# Patient Record
Sex: Female | Born: 1980 | Hispanic: Yes | Marital: Married | Smoking: Never smoker
Health system: Southern US, Community
[De-identification: ages and names within clinical notes are randomized; demographics above are authoritative.]

## PROBLEM LIST (undated history)

## (undated) DIAGNOSIS — K829 Disease of gallbladder, unspecified: Secondary | ICD-10-CM

## (undated) DIAGNOSIS — O24419 Gestational diabetes mellitus in pregnancy, unspecified control: Secondary | ICD-10-CM

## (undated) DIAGNOSIS — K8020 Calculus of gallbladder without cholecystitis without obstruction: Secondary | ICD-10-CM

## (undated) HISTORY — DX: Calculus of gallbladder without cholecystitis without obstruction: K80.20

## (undated) HISTORY — DX: Gestational diabetes mellitus in pregnancy, unspecified control: O24.419

---

## 2009-03-18 ENCOUNTER — Emergency Department (HOSPITAL_COMMUNITY): Admission: EM | Admit: 2009-03-18 | Discharge: 2009-03-18 | Payer: Self-pay | Admitting: Family Medicine

## 2009-11-13 ENCOUNTER — Other Ambulatory Visit: Admission: RE | Admit: 2009-11-13 | Discharge: 2009-11-13 | Payer: Self-pay | Admitting: Obstetrics and Gynecology

## 2010-05-21 ENCOUNTER — Ambulatory Visit: Payer: Self-pay | Admitting: Advanced Practice Midwife

## 2010-05-21 ENCOUNTER — Inpatient Hospital Stay (HOSPITAL_COMMUNITY): Admission: AD | Admit: 2010-05-21 | Discharge: 2010-05-23 | Payer: Self-pay | Admitting: Obstetrics & Gynecology

## 2010-11-19 LAB — CBC
MCHC: 33.9 g/dL (ref 30.0–36.0)
Platelets: 222 10*3/uL (ref 150–400)
RBC: 3.99 MIL/uL (ref 3.87–5.11)
RDW: 14.3 % (ref 11.5–15.5)
RDW: 14.4 % (ref 11.5–15.5)
WBC: 12.2 10*3/uL — ABNORMAL HIGH (ref 4.0–10.5)
WBC: 8.6 10*3/uL (ref 4.0–10.5)

## 2010-11-19 LAB — RPR: RPR Ser Ql: NONREACTIVE

## 2011-05-06 ENCOUNTER — Other Ambulatory Visit: Payer: Self-pay | Admitting: Obstetrics and Gynecology

## 2011-05-06 DIAGNOSIS — R102 Pelvic and perineal pain: Secondary | ICD-10-CM

## 2011-05-14 ENCOUNTER — Ambulatory Visit (HOSPITAL_COMMUNITY): Payer: Self-pay

## 2011-05-14 ENCOUNTER — Ambulatory Visit (HOSPITAL_COMMUNITY)
Admission: RE | Admit: 2011-05-14 | Discharge: 2011-05-14 | Disposition: A | Discharge status: Home / Self Care | Payer: Self-pay | Source: Ambulatory Visit | Attending: Obstetrics and Gynecology | Admitting: Obstetrics and Gynecology

## 2011-05-14 DIAGNOSIS — R102 Pelvic and perineal pain: Secondary | ICD-10-CM

## 2011-05-14 DIAGNOSIS — Z975 Presence of (intrauterine) contraceptive device: Secondary | ICD-10-CM | POA: Insufficient documentation

## 2011-05-14 DIAGNOSIS — N949 Unspecified condition associated with female genital organs and menstrual cycle: Secondary | ICD-10-CM | POA: Insufficient documentation

## 2012-02-28 ENCOUNTER — Encounter (HOSPITAL_COMMUNITY): Payer: Self-pay | Admitting: *Deleted

## 2012-02-28 ENCOUNTER — Other Ambulatory Visit: Payer: Self-pay

## 2012-02-28 ENCOUNTER — Emergency Department (HOSPITAL_COMMUNITY): Payer: Self-pay

## 2012-02-28 ENCOUNTER — Emergency Department (HOSPITAL_COMMUNITY)
Admission: EM | Admit: 2012-02-28 | Discharge: 2012-02-28 | Disposition: A | Discharge status: Home / Self Care | Payer: Self-pay | Attending: Emergency Medicine | Admitting: Emergency Medicine

## 2012-02-28 DIAGNOSIS — K8050 Calculus of bile duct without cholangitis or cholecystitis without obstruction: Secondary | ICD-10-CM

## 2012-02-28 DIAGNOSIS — K8020 Calculus of gallbladder without cholecystitis without obstruction: Secondary | ICD-10-CM | POA: Insufficient documentation

## 2012-02-28 LAB — DIFFERENTIAL
Basophils Relative: 0 % (ref 0–1)
Lymphocytes Relative: 43 % (ref 12–46)
Lymphs Abs: 3.1 10*3/uL (ref 0.7–4.0)
Monocytes Relative: 6 % (ref 3–12)
Neutro Abs: 3.6 10*3/uL (ref 1.7–7.7)
Neutrophils Relative %: 49 % (ref 43–77)

## 2012-02-28 LAB — HEPATIC FUNCTION PANEL
ALT: 16 U/L (ref 0–35)
AST: 28 U/L (ref 0–37)
Albumin: 4 g/dL (ref 3.5–5.2)
Total Bilirubin: 1.5 mg/dL — ABNORMAL HIGH (ref 0.3–1.2)

## 2012-02-28 LAB — URINALYSIS, ROUTINE W REFLEX MICROSCOPIC
Hgb urine dipstick: NEGATIVE
Nitrite: NEGATIVE
Specific Gravity, Urine: <1.005 — ABNORMAL LOW (ref 1.005–1.030)
Urobilinogen, UA: 0.2 mg/dL (ref 0.0–1.0)

## 2012-02-28 LAB — BASIC METABOLIC PANEL
BUN: 9 mg/dL (ref 6–23)
CO2: 21 mEq/L (ref 19–32)
Chloride: 102 mEq/L (ref 96–112)
GFR calc Af Amer: >90 mL/min (ref >90)
Glucose, Bld: 126 mg/dL — ABNORMAL HIGH (ref 70–99)
Potassium: 3.2 mEq/L — ABNORMAL LOW (ref 3.5–5.1)

## 2012-02-28 LAB — D-DIMER, QUANTITATIVE: D-Dimer, Quant: <0.22 ug/mL-FEU (ref 0.00–0.48)

## 2012-02-28 LAB — URINE MICROSCOPIC-ADD ON

## 2012-02-28 LAB — CBC
HCT: 38.9 % (ref 36.0–46.0)
Hemoglobin: 13.5 g/dL (ref 12.0–15.0)
RBC: 4.52 MIL/uL (ref 3.87–5.11)
WBC: 7.3 10*3/uL (ref 4.0–10.5)

## 2012-02-28 MED ORDER — IBUPROFEN 400 MG PO TABS
400.0000 mg | ORAL_TABLET | Freq: Once | ORAL | Status: AC
  Administered 2012-02-28: 400 mg via ORAL
  Filled 2012-02-28: qty 1

## 2012-02-28 MED ORDER — ONDANSETRON HCL 4 MG PO TABS: 4.0000 mg | ORAL_TABLET | Freq: Three times a day (TID) | ORAL | Status: AC | PRN

## 2012-02-28 MED ORDER — HYDROCODONE-ACETAMINOPHEN 5-325 MG PO TABS: ORAL_TABLET | ORAL | Status: AC

## 2012-02-28 MED ORDER — ACETAMINOPHEN 500 MG PO TABS
1000.0000 mg | ORAL_TABLET | Freq: Once | ORAL | Status: AC
  Administered 2012-02-28: 1000 mg via ORAL
  Filled 2012-02-28: qty 2

## 2012-02-28 NOTE — Discharge Instructions (Signed)
Resource Guide (Spanish)   Problemas Dentales  Pacientes con Medicaid    :                                                               [facility]   [employer] [phone]                          [phone] [address] [place], [place]                          [place], [place]                                             Phone:  [phone]               Phone:  [phone] Si usted no puede pagar o no tiene seguro mdico/dental pngase en contacto con: [facility] o el departamento de salud de [place] para que le autoricen el uso de la Therapist, art para adultos.  Problemas con Dolor Crnico Pngase en contacto con la clinica de dolores cronicos en el hospital de [place], [phone].   Los pacientes deben ser [employer] por su medico de cabecera.  Si usted no tiene dinero para pagar sus medicinas Pngase en contacto con [employer]:  llame al "211" o Insurance underwriter.   Phone: [phone]  Si usted no tiene mdico de [employer]      Phone: [phone]. Otras agencias que ofrecen cuidado mdico barato son:      [facility] [place]  [phone]      [facility]  [phone]      [facility]  [phone]      [facility]  [phone]      [facility]  [phone]      [facility]  [phone]  [facility]  [phone] [facility]  [phone] [facility]  [phone]  (servicios de emergencia [phone])  Abuse/Neglect [facility] Child Abuse Hotline [phone] [facility] Child Abuse Hotline 2485893722 (After Hours)  Emergency Shelter [facility] [phone]  Maternity Homes [facility] [phone] [facility] [phone]  MRSA Hotline #:   [phone]  Servicios de [facility]    [employer]           [facility] [address]                                    [address]       [address] [place]                                         Michell Heinrich  Michell Heinrich Phone:  [phone]                            Phone: [phone]                Phone: [phone]  [employer] [facility] Phone:  [phone]  [facility] Child Abuse Hotline [phone] [phone] (After Hours)     Eat a bland diet, avoiding greasy, fatty, fried foods, as well as spicy and acidic foods or beverages.  Avoid eating within the hour or 2 before going to bed or laying down.  Also avoid teas, colas, coffee, chocolate, pepermint and spearment.  Take the prescriptions as directed.  Call your regular medical doctor and the General Surgeon tomorrow to schedule a follow up appointment within the next week.  Return to the Emergency Department immediately if worsening.

## 2012-02-28 NOTE — ED Notes (Signed)
Chest pain for 15 min,no nausea, sob

## 2012-02-28 NOTE — ED Notes (Signed)
Family at bedside. 

## 2012-02-28 NOTE — ED Provider Notes (Signed)
History     CSN: [id number]  Arrival date & time 02/28/12  1221   First MD Initiated Contact with Patient 02/28/12 1417      Chief Complaint  Patient presents with  . Chest Pain     HPI Pt was seen at 1425.  Per pt, c/o gradual onset and persistence of constant lower mid-sternal chest and upper abd "pain" that began approx noon PTA.  Symptoms began while she was brushing her child's hair.  Describes the pain as lasting for approx 15 minutes before spontaneously resolving.  Pain radiated into her back and was associated with SOB.  Endorses similar symptoms intermittently "for a while," but usually occur several hours after eating.  Denies fevers, no cough, no flank pain, no N/V/D, no rash.    History reviewed. No pertinent past medical history.  History reviewed. No pertinent past surgical history.  History reviewed. No pertinent family history.  History  Substance Use Topics  . Smoking status: Never Smoker   . Smokeless tobacco: Not on file  . Alcohol Use: No    Review of Systems ROS: Statement: All systems negative except as marked or noted in the HPI; Constitutional: Negative for fever and chills. ; ; Eyes: Negative for eye pain, redness and discharge. ; ; ENMT: Negative for ear pain, hoarseness, nasal congestion, sinus pressure and sore throat. ; ; Cardiovascular: +CP, SOB. Negative for palpitations, diaphoresis, and peripheral edema. ; ; Respiratory: Negative for cough, wheezing and stridor. ; ; Gastrointestinal: +abd pain. Negative for nausea, vomiting, diarrhea, blood in stool, hematemesis, jaundice and rectal bleeding. . ; ; Genitourinary: Negative for dysuria, flank pain and hematuria. ; ; Musculoskeletal: Negative for back pain and neck pain. Negative for swelling and trauma.; ; Skin: Negative for pruritus, rash, abrasions, blisters, bruising and skin lesion.; ; Neuro: Negative for headache, lightheadedness and neck stiffness. Negative for weakness, altered level of  consciousness , altered mental status, extremity weakness, paresthesias, involuntary movement, seizure and syncope.     Allergies  Review of patient's allergies indicates no known allergies.  Home Medications  No current outpatient prescriptions on file.  BP 117/62  Pulse 67  Temp 98.4 F (36.9 C) (Oral)  Resp 20  Ht 5\' 6"  (1.676 m)  Wt 130 lb (58.968 kg)  BMI 20.98 kg/m2  SpO2 100%  LMP 02/21/2012  Physical Exam 1430: Physical examination:  Nursing notes reviewed; Vital signs and O2 SAT reviewed;  Constitutional: Well developed, Well nourished, Well hydrated, In no acute distress; Head:  Normocephalic, atraumatic; Eyes: EOMI, PERRL, No scleral icterus; ENMT: Mouth and pharynx normal, Mucous membranes moist; Neck: Supple, Full range of motion, No lymphadenopathy; Cardiovascular: Regular rate and rhythm, No murmur, rub, or gallop; Respiratory: Breath sounds clear & equal bilaterally, No rales, rhonchi, wheezes.  Speaking full sentences with ease, Normal respiratory effort/excursion; Chest: Nontender, Movement normal; Abdomen: Soft, Nontender, Nondistended, Normal bowel sounds; Genitourinary: No CVA tenderness; Extremities: Pulses normal, No tenderness, No edema, No calf edema or asymmetry.; Neuro: AA&Ox3, Major CN grossly intact.  Speech clear. No gross focal motor or sensory deficits in extremities.; Skin: Color normal, Warm, Dry.   ED Course  Procedures   MDM  MDM Reviewed: nursing note and vitals Interpretation: ECG, labs and x-ray    Date: 02/28/2012  Rate: 67  Rhythm: normal sinus rhythm  QRS Axis: normal  Intervals: normal  ST/T Wave abnormalities: normal  Conduction Disutrbances:none  Narrative Interpretation:   Old EKG Reviewed: none available.   Results for orders placed  during the hospital encounter of 02/28/12  CBC      Component Value Range   WBC 7.3  4.0 - 10.5 K/uL   RBC 4.52  3.87 - 5.11 MIL/uL   Hemoglobin 13.5  12.0 - 15.0 g/dL   HCT 16.1  09.6 -  04.5 %   MCV 86.1  78.0 - 100.0 fL   MCH 29.9  26.0 - 34.0 pg   MCHC 34.7  30.0 - 36.0 g/dL   RDW 40.9  81.1 - 91.4 %   Platelets 214  150 - 400 K/uL  DIFFERENTIAL      Component Value Range   Neutrophils Relative 49  43 - 77 %   Neutro Abs 3.6  1.7 - 7.7 K/uL   Lymphocytes Relative 43  12 - 46 %   Lymphs Abs 3.1  0.7 - 4.0 K/uL   Monocytes Relative 6  3 - 12 %   Monocytes Absolute 0.5  0.1 - 1.0 K/uL   Eosinophils Relative 1  0 - 5 %   Eosinophils Absolute 0.1  0.0 - 0.7 K/uL   Basophils Relative 0  0 - 1 %   Basophils Absolute 0.0  0.0 - 0.1 K/uL  BASIC METABOLIC PANEL      Component Value Range   Sodium 137  135 - 145 mEq/L   Potassium 3.2 (*) 3.5 - 5.1 mEq/L   Chloride 102  96 - 112 mEq/L   CO2 21  19 - 32 mEq/L   Glucose, Bld 126 (*) 70 - 99 mg/dL   BUN 9  6 - 23 mg/dL   Creatinine, Ser 7.82  0.50 - 1.10 mg/dL   Calcium 9.7  8.4 - 95.6 mg/dL   GFR calc non Af Amer >90  >90 mL/min   GFR calc Af Amer >90  >90 mL/min  TROPONIN I      Component Value Range   Troponin I <0.30  <0.30 ng/mL  HEPATIC FUNCTION PANEL      Component Value Range   Total Protein 7.5  6.0 - 8.3 g/dL   Albumin 4.0  3.5 - 5.2 g/dL   AST 28  0 - 37 U/L   ALT 16  0 - 35 U/L   Alkaline Phosphatase 55  39 - 117 U/L   Total Bilirubin 1.5 (*) 0.3 - 1.2 mg/dL   Bilirubin, Direct 0.3  0.0 - 0.3 mg/dL   Indirect Bilirubin 1.2 (*) 0.3 - 0.9 mg/dL  LIPASE, BLOOD      Component Value Range   Lipase 28  11 - 59 U/L  D-DIMER, QUANTITATIVE      Component Value Range   D-Dimer, Quant <0.22  0.00 - 0.48 ug/mL-FEU  PREGNANCY, URINE      Component Value Range   Preg Test, Ur NEGATIVE  NEGATIVE  URINALYSIS, ROUTINE W REFLEX MICROSCOPIC      Component Value Range   Color, Urine YELLOW  YELLOW   APPearance CLEAR  CLEAR   Specific Gravity, Urine <1.005 (*) 1.005 - 1.030   pH 6.5  5.0 - 8.0   Glucose, UA NEGATIVE  NEGATIVE mg/dL   Hgb urine dipstick NEGATIVE  NEGATIVE   Bilirubin Urine NEGATIVE  NEGATIVE    Ketones, ur NEGATIVE  NEGATIVE mg/dL   Protein, ur NEGATIVE  NEGATIVE mg/dL   Urobilinogen, UA 0.2  0.0 - 1.0 mg/dL   Nitrite NEGATIVE  NEGATIVE   Leukocytes, UA TRACE (*) NEGATIVE  URINE MICROSCOPIC-ADD ON  Component Value Range   Squamous Epithelial / LPF FEW (*) RARE   WBC, UA 0-2  <3 WBC/hpf   Bacteria, UA RARE  RARE   Dg Chest 2 View 02/28/2012  *RADIOLOGY REPORT*  Clinical Data: Chest pain  CHEST - 2 VIEW  Comparison: None  Findings: Upper-normal size of cardiac silhouette. Mediastinal contours and pulmonary vascularity normal. Numerous cardiac monitoring leads project over chest. Lungs appear slightly hyperaerated but clear. No pleural effusion or pneumothorax. No acute osseous findings.  IMPRESSION: Slightly hyperinflated lungs. Otherwise negative exam.  Original Report Authenticated By: Lollie Marrow, M.D.   US Abdomen Complete 02/28/2012  *RADIOLOGY REPORT*  Clinical Data:  Upper abdominal pain.  COMPLETE ABDOMINAL ULTRASOUND  Comparison:  None.  Findings:  Gallbladder:  Gallbladder is filled with gallstones.  No wall thickening.  Negative sonographic Murphy's.  Common bile duct:   Normal caliber, 6 mm.  Liver:  No focal lesion identified.  Within normal limits in parenchymal echogenicity.  IVC:  Appears normal.  Pancreas:  No focal abnormality seen.  Spleen:  Within normal limits in size and echotexture.  Right Kidney:   Normal in size and parenchymal echogenicity.  No evidence of mass or hydronephrosis.  Left Kidney:  Normal in size and parenchymal echogenicity.  No evidence of mass or hydronephrosis.  Abdominal aorta:  No aneurysm identified.  IMPRESSION: Gallbladder is filled with gallstones.  No changes of acute cholecystitis.  Original Report Authenticated By: Cyndie Chime, M.D.     6:28 PM:   Pt stated her pain was minimal by arrival to ED, continues improved and wants to go home now.  No LFT or lipase elevation.  No signs of acute cholecystitis on [place].  Possible biliary colic  at this time.  Strict diet instructions and return precautions given.  Dx testing d/w pt and family.  Questions answered.  Verb understanding, agreeable to d/c home with outpt f/u.           Laray Anger, DO 03/02/12 1937

## 2012-02-28 NOTE — ED Notes (Signed)
Patient is comfortable and does not need anything at this time. 

## 2012-09-01 ENCOUNTER — Emergency Department (HOSPITAL_COMMUNITY)
Admission: EM | Admit: 2012-09-01 | Discharge: 2012-09-01 | Disposition: A | Discharge status: Home / Self Care | Payer: Self-pay | Attending: Emergency Medicine | Admitting: Emergency Medicine

## 2012-09-01 ENCOUNTER — Encounter (HOSPITAL_COMMUNITY): Payer: Self-pay | Admitting: Nurse Practitioner

## 2012-09-01 DIAGNOSIS — K5900 Constipation, unspecified: Secondary | ICD-10-CM | POA: Insufficient documentation

## 2012-09-01 DIAGNOSIS — R197 Diarrhea, unspecified: Secondary | ICD-10-CM | POA: Insufficient documentation

## 2012-09-01 DIAGNOSIS — R110 Nausea: Secondary | ICD-10-CM | POA: Insufficient documentation

## 2012-09-01 DIAGNOSIS — Z3202 Encounter for pregnancy test, result negative: Secondary | ICD-10-CM | POA: Insufficient documentation

## 2012-09-01 DIAGNOSIS — Z8719 Personal history of other diseases of the digestive system: Secondary | ICD-10-CM | POA: Insufficient documentation

## 2012-09-01 DIAGNOSIS — G8929 Other chronic pain: Secondary | ICD-10-CM | POA: Insufficient documentation

## 2012-09-01 LAB — COMPREHENSIVE METABOLIC PANEL
Alkaline Phosphatase: 60 U/L (ref 39–117)
BUN: 9 mg/dL (ref 6–23)
Calcium: 9.3 mg/dL (ref 8.4–10.5)
Creatinine, Ser: 0.72 mg/dL (ref 0.50–1.10)
GFR calc Af Amer: >90 mL/min (ref >90)
Glucose, Bld: 94 mg/dL (ref 70–99)
Total Protein: 7.4 g/dL (ref 6.0–8.3)

## 2012-09-01 LAB — CBC WITH DIFFERENTIAL/PLATELET
Eosinophils Absolute: 0.2 10*3/uL (ref 0.0–0.7)
Eosinophils Relative: 3 % (ref 0–5)
Hemoglobin: 13.8 g/dL (ref 12.0–15.0)
Lymphs Abs: 1.2 10*3/uL (ref 0.7–4.0)
MCH: 29.4 pg (ref 26.0–34.0)
MCV: 87.2 fL (ref 78.0–100.0)
Monocytes Absolute: 0.5 10*3/uL (ref 0.1–1.0)
Monocytes Relative: 7 % (ref 3–12)
RBC: 4.69 MIL/uL (ref 3.87–5.11)

## 2012-09-01 LAB — URINALYSIS, MICROSCOPIC ONLY
Bilirubin Urine: NEGATIVE
Nitrite: NEGATIVE
Specific Gravity, Urine: 1.027 (ref 1.005–1.030)
Urobilinogen, UA: 2 mg/dL — ABNORMAL HIGH (ref 0.0–1.0)

## 2012-09-01 LAB — LIPASE, BLOOD: Lipase: 23 U/L (ref 11–59)

## 2012-09-01 MED ORDER — DICYCLOMINE HCL 20 MG PO TABS: 20.0000 mg | ORAL_TABLET | Freq: Two times a day (BID) | ORAL | Status: DC

## 2012-09-01 MED ORDER — GI COCKTAIL ~~LOC~~
30.0000 mL | Freq: Once | ORAL | Status: AC
  Administered 2012-09-01: 30 mL via ORAL
  Filled 2012-09-01: qty 30

## 2012-09-01 MED ORDER — PANTOPRAZOLE SODIUM 20 MG PO TBEC: 40.0000 mg | DELAYED_RELEASE_TABLET | Freq: Every day | ORAL | Status: DC

## 2012-09-01 NOTE — ED Provider Notes (Signed)
History     CSN: [id number]  Arrival date & time 09/01/12  1635   First MD Initiated Contact with Patient 09/01/12 2043      Chief Complaint  Patient presents with  . Abdominal Pain    (Consider location/radiation/quality/duration/timing/severity/associated sxs/prior treatment) Patient is a 31 y.o. female presenting with general illness. The history is provided by the patient. No language interpreter was used.  Illness  The current episode started more than 2 weeks ago. The onset is undetermined. The problem occurs frequently. The problem has been unchanged. The problem is moderate. Nothing relieves the symptoms. Nothing aggravates the symptoms. Associated symptoms include abdominal pain, constipation, diarrhea and nausea. Pertinent negatives include no fever, no vomiting, no congestion, no headaches, no sore throat, no cough and no rash.    Past Medical History  Diagnosis Date  . Gallbladder attack     History reviewed. No pertinent past surgical history.  History reviewed. No pertinent family history.  History  Substance Use Topics  . Smoking status: Never Smoker   . Smokeless tobacco: Not on file  . Alcohol Use: No    OB History    Grav Para Term Preterm Abortions TAB SAB Ect Mult Living                  Review of Systems  Constitutional: Negative for fever and chills.  HENT: Negative for congestion and sore throat.   Respiratory: Negative for cough and shortness of breath.   Cardiovascular: Negative for chest pain and leg swelling.  Gastrointestinal: Positive for nausea, abdominal pain, diarrhea and constipation. Negative for vomiting.  Genitourinary: Negative for dysuria and frequency.  Skin: Negative for color change and rash.  Neurological: Negative for dizziness and headaches.  Psychiatric/Behavioral: Negative for confusion and agitation.  All other systems reviewed and are negative.    Allergies  Review of patient's allergies indicates no known  allergies.  Home Medications  No current outpatient prescriptions on file.  BP 117/62  Pulse 70  Temp 98.4 F (36.9 C) (Oral)  Resp 14  SpO2 100%  Physical Exam  Vitals reviewed. Constitutional: She is oriented to person, place, and time. She appears well-developed and well-nourished. No distress.  HENT:  Head: Normocephalic and atraumatic.  Eyes: EOM are normal. Pupils are equal, round, and reactive to light.  Neck: Normal range of motion. Neck supple.  Cardiovascular: Normal rate and regular rhythm.   Pulmonary/Chest: Effort normal. No respiratory distress.  Abdominal: Soft. She exhibits no distension. There is no hepatosplenomegaly. There is tenderness in the right lower quadrant and left lower quadrant. There is no rigidity, no rebound, no guarding, no CVA tenderness, no tenderness at McBurney's point and negative Murphy's sign.    Musculoskeletal: Normal range of motion. She exhibits no edema.  Neurological: She is alert and oriented to person, place, and time.  Skin: Skin is warm and dry.  Psychiatric: She has a normal mood and affect. Her behavior is normal.    ED Course  Procedures (including critical care time)  Results for orders placed during the hospital encounter of 09/01/12  CBC WITH DIFFERENTIAL      Component Value Range   WBC 7.1  4.0 - 10.5 K/uL   RBC 4.69  3.87 - 5.11 MIL/uL   Hemoglobin 13.8  12.0 - 15.0 g/dL   HCT 14.7  82.9 - 56.2 %   MCV 87.2  78.0 - 100.0 fL   MCH 29.4  26.0 - 34.0 pg   MCHC 33.7  30.0 - 36.0 g/dL   RDW 40.9  81.1 - 91.4 %   Platelets 196  150 - 400 K/uL   Neutrophils Relative 73  43 - 77 %   Neutro Abs 5.2  1.7 - 7.7 K/uL   Lymphocytes Relative 17  12 - 46 %   Lymphs Abs 1.2  0.7 - 4.0 K/uL   Monocytes Relative 7  3 - 12 %   Monocytes Absolute 0.5  0.1 - 1.0 K/uL   Eosinophils Relative 3  0 - 5 %   Eosinophils Absolute 0.2  0.0 - 0.7 K/uL   Basophils Relative 0  0 - 1 %   Basophils Absolute 0.0  0.0 - 0.1 K/uL    COMPREHENSIVE METABOLIC PANEL      Component Value Range   Sodium 139  135 - 145 mEq/L   Potassium 3.6  3.5 - 5.1 mEq/L   Chloride 102  96 - 112 mEq/L   CO2 27  19 - 32 mEq/L   Glucose, Bld 94  70 - 99 mg/dL   BUN 9  6 - 23 mg/dL   Creatinine, Ser 7.82  0.50 - 1.10 mg/dL   Calcium 9.3  8.4 - 95.6 mg/dL   Total Protein 7.4  6.0 - 8.3 g/dL   Albumin 3.9  3.5 - 5.2 g/dL   AST 12  0 - 37 U/L   ALT 9  0 - 35 U/L   Alkaline Phosphatase 60  39 - 117 U/L   Total Bilirubin 1.4 (*) 0.3 - 1.2 mg/dL   GFR calc non Af Amer >90  >90 mL/min   GFR calc Af Amer >90  >90 mL/min  LIPASE, BLOOD      Component Value Range   Lipase 23  11 - 59 U/L  URINALYSIS, MICROSCOPIC ONLY      Component Value Range   Color, Urine YELLOW  YELLOW   APPearance CLEAR  CLEAR   Specific Gravity, Urine 1.027  1.005 - 1.030   pH 8.0  5.0 - 8.0   Glucose, UA NEGATIVE  NEGATIVE mg/dL   Hgb urine dipstick NEGATIVE  NEGATIVE   Bilirubin Urine NEGATIVE  NEGATIVE   Ketones, ur NEGATIVE  NEGATIVE mg/dL   Protein, ur NEGATIVE  NEGATIVE mg/dL   Urobilinogen, UA 2.0 (*) 0.0 - 1.0 mg/dL   Nitrite NEGATIVE  NEGATIVE   Leukocytes, UA NEGATIVE  NEGATIVE   WBC, UA 0-2  <3 WBC/hpf   RBC / HPF 0-2  <3 RBC/hpf   Bacteria, UA RARE  RARE   Squamous Epithelial / LPF FEW (*) RARE  POCT PREGNANCY, URINE      Component Value Range   Preg Test, Ur NEGATIVE  NEGATIVE    No results found.   No diagnosis found.    MDM  Pt w/ > 6 month hx of crampy diffuse abdominal pain. Seen at [facility] and dx w/ UTI. States persistent pain, worse at night. At times radiates to left flank.  A/w nausea - no vomiting. Intermittent episodes of diarrhea and constipation. Dysuria/polyuria/hematura. No vaginal bleeding/discharge or irritation. Has hx of biliary pain - no dx of cholecystitis/choledocholithiasis. Denies fever/chills.   Exam: afebrile, vitals unremarkable, well appearing, NAD. No CVA tenderness, abd soft, minimally ttp BLQ, no  rebound or guarding - no clinical peritonitis. No suprapubic pain.   Plan: u/a neg, cmp lipase and cbc unremarkable, hcg neg. Based on chronicity of sx, benign exam and lack of laboratory abnormality - doubt acute  cholecystitis/cholangitis, SBO, mesenteric ischemia, AAA, perforated viscus, diverticulitis, ectopic, PID, torsion, TOA. I feel that her sx are possibly 2/2 IBS/IBD or gastritis/PUD. Given GI cocktail w/ some relief of sx. At this time no indication for imaging. Will d/c home w/ rx for bentyl and protonix. Follow up w/ GI for chronic abdominal pain and consider scope. D/c in good and stable condition w/ return precautions.   1. Chronic abdominal pain   2. Nausea   3. Diarrhea    New Prescriptions   DICYCLOMINE (BENTYL) 20 MG TABLET    Take 1 tablet (20 mg total) by mouth 2 (two) times daily.   PANTOPRAZOLE (PROTONIX) 20 MG TABLET    Take 2 tablets (40 mg total) by mouth daily.   [facility]er END[facility] [address] [place] [address]  Schedule an appointment as soon as possible for a visit          Audelia Hives, MD 09/02/12 [phone]

## 2012-09-01 NOTE — ED Notes (Signed)
Co intermittent abd pain and nausea for past 6 months, worse at night and after eating. States pain is generalized across entire abd and sometimes radiates into flank area. Pt denies any similar history.

## 2012-09-01 NOTE — ED Notes (Signed)
Pt dc to home.  Pt states understanding to dc paperwork and will f/u with gastro md on Monday.  Pt ambulatory to exit without difficulty.  Pt denies need for w/c

## 2012-09-02 NOTE — ED Provider Notes (Signed)
I saw and evaluated the patient, reviewed the resident's note and I agree with the findings and plan.  Ethelda Chick, MD 09/02/12 [phone]

## 2012-09-07 ENCOUNTER — Encounter (HOSPITAL_COMMUNITY): Payer: Self-pay | Admitting: *Deleted

## 2012-12-25 ENCOUNTER — Ambulatory Visit (INDEPENDENT_AMBULATORY_CARE_PROVIDER_SITE_OTHER): Payer: BC Managed Care – PPO | Admitting: Emergency Medicine

## 2012-12-25 VITALS — BP 122/78 | HR 86 | Temp 98.5°F | Resp 16 | Ht 65.0 in | Wt 133.0 lb

## 2012-12-25 DIAGNOSIS — K8020 Calculus of gallbladder without cholecystitis without obstruction: Secondary | ICD-10-CM

## 2012-12-25 DIAGNOSIS — K5900 Constipation, unspecified: Secondary | ICD-10-CM

## 2012-12-25 DIAGNOSIS — R1032 Left lower quadrant pain: Secondary | ICD-10-CM

## 2012-12-25 LAB — POCT URINALYSIS DIPSTICK
Bilirubin, UA: NEGATIVE
Glucose, UA: NEGATIVE
Ketones, UA: NEGATIVE
Leukocytes, UA: NEGATIVE

## 2012-12-25 LAB — POCT CBC
Granulocyte percent: 54 %G (ref 37–80)
HCT, POC: 39.6 % (ref 37.7–47.9)
Hemoglobin: 12.6 g/dL (ref 12.2–16.2)
Lymph, poc: 2.5 (ref 0.6–3.4)
POC Granulocyte: 3.5 (ref 2–6.9)
POC MID %: 7.9 %M (ref 0–12)
RBC: 4.42 M/uL (ref 4.04–5.48)

## 2012-12-25 LAB — POCT UA - MICROSCOPIC ONLY
Bacteria, U Microscopic: NEGATIVE
Mucus, UA: NEGATIVE
Yeast, UA: NEGATIVE

## 2012-12-25 MED ORDER — POLYETHYLENE GLYCOL 3350 17 GM/SCOOP PO POWD: 17.0000 g | Freq: Every day | ORAL | Status: DC

## 2012-12-25 NOTE — Progress Notes (Signed)
[facility] [address] [place] [address] [phone]  Date:  12/25/2012   Name:  [name] Wu   DOB:  Dec 06, 1980   MRN:  [id number]  PCP:  No primary provider on file.    Chief Complaint: Abdominal Pain   History of Present Illness:  [name] Wu is a 32 y.o. very pleasant female patient who presents with the following:  Left sided abdominal pain started yesterday.  No nausea or vomiting.  No stool change.  Tends to be constipated and LLQ pain worse when strains at stool  The patient has no complaint of blood, mucous, or pus in her stools.  No fever or chills.  Uses an IUD.  LMP 12/09/12.  No dysuria, urgency or frequency. Some dyspareunia.  History of gallstones but pain is not in RUQ.  Some increase in pain with  Intolerance of greasy and fried foods and chilles.  No vaginal discharge.  No improvement with over the counter medications or other home remedies. Denies other complaint or health concern today.   There is no problem list on file for this patient.   Past Medical History  Diagnosis Date  . Gallbladder attack     History reviewed. No pertinent past surgical history.  History  Substance Use Topics  . Smoking status: Never Smoker   . Smokeless tobacco: Not on file  . Alcohol Use: No    History reviewed. No pertinent family history.  No Known Allergies  Medication list has been reviewed and updated.  Current Outpatient Prescriptions on File Prior to Visit  Medication Sig Dispense Refill  . dicyclomine (BENTYL) 20 MG tablet Take 1 tablet (20 mg total) by mouth 2 (two) times daily.  20 tablet  0  . pantoprazole (PROTONIX) 20 MG tablet Take 2 tablets (40 mg total) by mouth daily.  30 tablet  0   No current facility-administered medications on file prior to visit.    Review of Systems:  As per HPI, otherwise negative.    Physical Examination: Filed Vitals:   12/25/12 0929  BP: 122/78  Pulse: 86  Temp: 98.5 F  (36.9 C)  Resp: 16   Filed Vitals:   12/25/12 0929  Height: 5\' 5"  (1.651 m)  Weight: 133 lb (60.328 kg)   Body mass index is 22.13 kg/(m^2). Ideal Body Weight: Weight in (lb) to have BMI = 25: 149.9  GEN: WDWN, NAD, Non-toxic, A & O x 3 HEENT: Atraumatic, Normocephalic. Neck supple. No masses, No LAD. Ears and Nose: No external deformity. CV: RRR, No M/G/R. No JVD. No thrill. No extra heart sounds. PULM: CTA B, no wheezes, crackles, rhonchi. No retractions. No resp. distress. No accessory muscle use. ABD: S, tender minimally in LLQ, ND, +BS. No rebound. No HSM. EXTR: No c/c/e NEURO Normal gait.  PSYCH: Normally interactive. Conversant. Not depressed or anxious appearing.  Calm demeanor.    Assessment and Plan: Cholelithiasis Constipation Surgical consultation miralax   Signed,  Phillips Odor, MD   Results for orders placed in visit on 12/25/12  POCT CBC      Result Value Range   WBC 6.5  4.6 - 10.2 K/uL   Lymph, poc 2.5  0.6 - 3.4   POC LYMPH PERCENT 38.1  10 - 50 %L   MID (cbc) 0.5  0 - 0.9   POC MID % 7.9  0 - 12 %M   POC Granulocyte 3.5  2 - 6.9   Granulocyte percent 54.0  37 -  80 %G   RBC 4.42  4.04 - 5.48 M/uL   Hemoglobin 12.6  12.2 - 16.2 g/dL   HCT, POC 19.1  47.8 - 47.9 %   MCV 89.5  80 - 97 fL   MCH, POC 28.5  27 - 31.2 pg   MCHC 31.8  31.8 - 35.4 g/dL   RDW, POC 29.5     Platelet Count, POC 224  142 - 424 K/uL   MPV 10.1  0 - 99.8 fL  POCT URINALYSIS DIPSTICK      Result Value Range   Color, UA yellow     Clarity, UA clear     Glucose, UA neg     Bilirubin, UA neg     Ketones, UA neg     Spec Grav, UA 1.025     Blood, UA small     pH, UA 7.0     Protein, UA neg     Urobilinogen, UA 0.2     Nitrite, UA neg     Leukocytes, UA Negative    POCT UA - MICROSCOPIC ONLY      Result Value Range   WBC, Ur, HPF, POC 0-2     RBC, urine, microscopic 0-1     Bacteria, U Microscopic neg     Mucus, UA neg     Epithelial cells, urine per micros  0-2     Crystals, Ur, HPF, POC neg     Casts, Ur, LPF, POC neg     Yeast, UA neg    POCT URINE PREGNANCY      Result Value Range   Preg Test, Ur Negative

## 2012-12-25 NOTE — Patient Instructions (Addendum)
Constipacin - Adulto  (Constipation, Adult)  Constipacin significa que una persona tiene menos de 3 evacuaciones en una semana, hay dificultad para evacuar el intestino, o las heces son secas, duras, o ms grandes que lo normal. A medida que envejecemos el estreimiento es ms comn. Si intenta curar el estreimiento con medicamentos que producen la evacuacin intestinal (laxantes), el problema puede empeorar. El uso prolongado de laxantes puede hacer que los msculos del colon se debiliten. Una dieta baja en fibra, no tomar suficientes lquidos y el uso de ciertos medicamentos pueden Agricultural engineer.  CAUSAS   Ciertos medicamentos, como los antidepresivos, analgsicos, suplementos de hierro, anticidos y diurticos.   Algunas enfermedades, como la diabetes, el sndrome del colon irritable (SII), enfermedad de la tiroides, o depresin.   No beber suficiente agua.   No consumir suficientes alimentos ricos en fibra.   Situaciones de estrs o viajes.  Falta de actividad fsica o de ejercicio.  No ir al bao cuando siente la necesidad.  Ignorar la necesidad sbita de Film/video editor intestino.  Uso en exceso de laxantes. SNTOMAS   Evacuar el intestino menos de 3 veces a la semana.   Dificultad para mover el [place] y duras, o ms grandes que las normales.   Sensacin de estar lleno o distendido.   Dolor en la parte baja del abdomen  No se siente alivio despus de evacuar el intestino. DIAGNSTICO  El mdico le har una historia clnica y le har un examen fsico. Pueden hacerle exmenes adicionales para el estreimiento grave. Algunas pruebas son:   Un radiografa con enema de bario para examinar el recto, el colon y en algunos casos el intestino delgado.  Una sigmoidoscopia para examinar el colon inferior.  Una colonoscopia para examinar todo el colon. TRATAMIENTO  El tratamiento depender de la gravedad de la constipacin y de la  causa. Algunos tratamientos dietticos son beber ms lquidos y comer ms alimentos ricos en fibra. El cambio en el estilo de vida incluye hacer ejercicios de [place] regular. Si estas recomendaciones para Animator dieta y en el estilo de vida no ayudan, el mdico le puede indicar el uso de laxantes de venta libre para Industrial/product designer movimiento intestinal. Los medicamentos con Statistician se pueden prescribir si los medicamentos de venta libre no lo mejoran.  INSTRUCCIONES PARA EL CUIDADO EN EL HOGAR   Aumente el consumo de alimentos con [place], como frutas, verduras, granos enteros y frijoles. Limite los azcares ricos en grasas y procesados   en su dieta, tales como papas fritas, hamburguesas, galletas, dulces y refrescos.   Puede agregar un suplemento de fibra a su dieta si no obtiene lo suficiente de los alimentos.   Debe ingerir gran cantidad de lquido para mantener la orina de tono claro o color amarillo plido.   Haga ejercicios regularmente o segn las indicaciones de su mdico.   Vaya al bao cuando sienta la necesidad de ir. No espere.  Tome slo la medicacin que le indic el profesional.  No tome otros medicamentos para la constipacin sin Teacher, adult education a su mdico. SOLICITE ATENCIN MDICA DE INMEDIATO SI:   Observa sangre brillante en las heces.   La constipacin dura ms de 4 das o empeora.   Siente dolor abdominal o rectal.   Las heces son delgadas como un lpiz.  Pierde peso de [place] inexplicable. ASEGRESE DE QUE:   Comprende estas instrucciones.  Controlar su enfermedad.  Solicitar ayuda de inmediato si  no mejora o empeora. Document Released: 09/12/2007 Document Revised: 02/22/2012 [facility] Patient Information 2013 [place], [place]. Colecistitis  (Cholecystitis)  La colecistitis es la inflamacin de la vescula biliar. Generalmente la causa es la formacin de clculos biliares o sedimentos (colelitiasis)) en la vescula. La vescula almacena un  lquido que ayuda a digerir las grasas (bilis). La colecistitis es una enfermedad grave y requiere tratamiento inmediato.  CAUSAS   Clculos biliares. Los clculos biliares pueden obstruir el conducto que conduce a la vescula biliar, causando la acumulacin de bilis. Cuando la bilis se acumula, la vescula biliar se inflama.  Problemas en el conducto biliar, como la obstruccin por cicatrizacin o torsin.  Tumores. Los tumores pueden impedir que la bilis salga de la vescula adecuadamente, causando la acumulacin de la misma. Cuando la bilis se acumula, la vescula se inflama. SNTOMAS   Nuseas  Vmitos.  Dolor abdominal, especialmente en la zona superior derecha del abdomen.  Sensibilidad o hinchazn abdominal.  Sudoracin.  Escalofros.  Grant Ruts.  Color amarillo de la piel y en la zona blanca del ojo (ictericia). DIAGNSTICO  Su mdico puede indicar exmenes de sangre para Engineer, manufacturing una infeccin o problemas en la vescula biliar. Tambin puede ordenar pruebas de diagnstico por imgenes, como ecografas o tomografa computada (CT). Otras pruebas pueden incluir un estudio de gammagrafa hepatobiliar con cido iminodiactico (HIDA). Esta exploracin permite a su mdico ver el paso de la bilis desde el hgado hasta la vescula biliar y el intestino delgado.  TRATAMIENTO  La hospitalizacin suele ser necesaria para disminuir la inflamacin de la vescula biliar. Posiblemente le indiquen que no coma ni beba nada (ayuno) durante cierto perodo de [place]. Podrn indicarle un medicamento para calmar el dolor o un antibitico para tratar la infeccin. Puede ser necesario realizar Neomia Dear ciruga para extirpar la vescula biliar (colecistectoma) cuando la inflamacin haya disminudo. Podra necesitar de inmediato una ciruga si aparecen complicaciones como la muerte del tejido de la vescula biliar (gangrena) o la ruptura (perforacin)) de la vescula biliar.  INSTRUCCIONES PARA EL CUIDADO EN EL  HOGAR  El cuidado en el hogar depender del tipo de [place]. En general:   Si le han recetado antibiticos, tmelos segn las indicaciones. Tmelos todos, aunque se sienta mejor.  Solo tome medicamentos de venta libre o recetados para Chief Technology Officer, Dentist o [place], segn las indicaciones del mdico.  Siga una dieta baja en grasas hasta que vuelva a ver al mdico nuevamente.  Cumpla con todas las visitas de control, segn le indique su mdico. SOLICITE ATENCIN MDICA DE INMEDIATO SI:   El dolor aumenta y no puede controlarlo con los medicamentos.  El dolor se traslada hacia alguna otra zona del abdomen o hacia la espalda.  Tiene fiebre.  Tiene nuseas o vmitos. ASEGRESE DE QUE:   Comprende estas instrucciones.  Controlar su enfermedad.  Solicitar ayuda de inmediato si no mejora o si empeora. Document Released: 06/02/2005 Document Revised: 11/15/2011 [facility] Patient Information 2013 [place], [place].

## 2013-01-16 ENCOUNTER — Ambulatory Visit (INDEPENDENT_AMBULATORY_CARE_PROVIDER_SITE_OTHER): Payer: Self-pay | Admitting: Surgery

## 2013-01-30 ENCOUNTER — Encounter (INDEPENDENT_AMBULATORY_CARE_PROVIDER_SITE_OTHER): Payer: Self-pay | Admitting: Surgery

## 2013-01-30 ENCOUNTER — Ambulatory Visit (INDEPENDENT_AMBULATORY_CARE_PROVIDER_SITE_OTHER): Payer: BC Managed Care – PPO | Admitting: Surgery

## 2013-01-30 VITALS — BP 120/62 | HR 60 | Temp 97.4°F | Resp 16 | Ht 65.0 in | Wt 136.0 lb

## 2013-01-30 DIAGNOSIS — K8020 Calculus of gallbladder without cholecystitis without obstruction: Secondary | ICD-10-CM | POA: Insufficient documentation

## 2013-01-30 HISTORY — DX: Calculus of gallbladder without cholecystitis without obstruction: K80.20

## 2013-01-30 NOTE — Progress Notes (Signed)
Patient ID: [name] Wu, female   DOB: [birth year], 32 y.o.   MRN: [id number]  Chief Complaint  Patient presents with  . New Evaluation    eval cholelithiasis    HPI [name] Wu is a 32 y.o. female.   HPI She is referred by Dr. Noreene Wu for evaluation of symptomatic cholelithiasis. She has known for at least a year that she has had gallstones. She has been having attacks of right upper quadrant abdominal pain nausea and vomiting after fatty meals for over a year. Currently today she is pain-free. Her attacks are quite frequent. Her pain to be moderate to severe in intensity. She denies jaundice. Bowel movements are normal. Past Medical History  Diagnosis Date  . Gallbladder attack     History reviewed. No pertinent past surgical history.  Family History  Problem Relation Age of Onset  . Hypertension Mother   . Cancer Father     pancreatic    Social History History  Substance Use Topics  . Smoking status: Never Smoker   . Smokeless tobacco: Not on file  . Alcohol Use: No    No Known Allergies  Current Outpatient Prescriptions  Medication Sig Dispense Refill  . pantoprazole (PROTONIX) 20 MG tablet Take 2 tablets (40 mg total) by mouth daily.  30 tablet  0  . polyethylene glycol powder (GLYCOLAX/MIRALAX) powder Take 17 g by mouth daily.  3350 g  1   No current facility-administered medications for this visit.    Review of Systems Review of Systems  Constitutional: Negative for fever, chills and unexpected weight change.  HENT: Negative for hearing loss, congestion, sore throat, trouble swallowing and voice change.   Eyes: Negative for visual disturbance.  Respiratory: Negative for cough and wheezing.   Cardiovascular: Negative for chest pain, palpitations and leg swelling.  Gastrointestinal: Positive for nausea and abdominal pain. Negative for vomiting, diarrhea, constipation, blood in stool, abdominal distention and anal bleeding.  Genitourinary:  Negative for hematuria, vaginal bleeding and difficulty urinating.  Musculoskeletal: Negative for arthralgias.  Skin: Negative for rash and wound.  Neurological: Negative for seizures, syncope and headaches.  Hematological: Negative for adenopathy. Does not bruise/bleed easily.  Psychiatric/Behavioral: Negative for confusion.    Blood pressure 120/62, pulse 60, temperature 97.4 F (36.3 C), temperature source Temporal, resp. rate 16, height 5\' 5"  (1.651 m), weight 136 lb (61.689 kg).  Physical Exam Physical Exam  Constitutional: She appears well-nourished. No distress.  HENT:  Head: Normocephalic and atraumatic.  Right Ear: External ear normal.  Left Ear: External ear normal.  Nose: Nose normal.  Eyes: Conjunctivae are normal. Right eye exhibits no discharge. Left eye exhibits no discharge. No scleral icterus.  Neck: No tracheal deviation present. No thyromegaly present.  Cardiovascular: Normal rate, regular rhythm, normal heart sounds and intact distal pulses.   No murmur heard. Pulmonary/Chest: Effort normal and breath sounds normal. She has no wheezes.  Abdominal: Soft. Bowel sounds are normal. She exhibits no distension. There is no tenderness.  Musculoskeletal: Normal range of motion. She exhibits no edema.  Skin: Skin is warm and dry. She is not diaphoretic.  Psychiatric: Her behavior is normal. Judgment normal.    Data Reviewed I have reviewed her ultrasound from last year showing cholelithiasis. The bile duct is normal.  Assessment    Symptomatically Cholelithiasis     Plan    Laparoscopic cholecystectomy with possible cholangiogram is recommended. I discussed this with the patient and her husband. I gave her literature regarding the surgery. I  discussed the risks which includes but is not limited to bleeding, infection, bile duct injury, bile leak, injury to other structures, need to convert to an open procedure, et Karie Soda. She understands and wished to proceed.  Likelihood of success is good        [name] Wu A 01/30/2013, 10:53 AM

## 2013-03-14 ENCOUNTER — Emergency Department (HOSPITAL_COMMUNITY)
Admission: EM | Admit: 2013-03-14 | Discharge: 2013-03-15 | Disposition: A | Discharge status: Home / Self Care | Payer: BC Managed Care – PPO | Attending: Emergency Medicine | Admitting: Emergency Medicine

## 2013-03-14 ENCOUNTER — Encounter (HOSPITAL_COMMUNITY): Payer: Self-pay | Admitting: Emergency Medicine

## 2013-03-14 DIAGNOSIS — R109 Unspecified abdominal pain: Secondary | ICD-10-CM

## 2013-03-14 DIAGNOSIS — Z3202 Encounter for pregnancy test, result negative: Secondary | ICD-10-CM | POA: Insufficient documentation

## 2013-03-14 DIAGNOSIS — R1011 Right upper quadrant pain: Secondary | ICD-10-CM | POA: Insufficient documentation

## 2013-03-14 DIAGNOSIS — K8020 Calculus of gallbladder without cholecystitis without obstruction: Secondary | ICD-10-CM | POA: Insufficient documentation

## 2013-03-14 DIAGNOSIS — Z8719 Personal history of other diseases of the digestive system: Secondary | ICD-10-CM | POA: Insufficient documentation

## 2013-03-14 DIAGNOSIS — K8050 Calculus of bile duct without cholangitis or cholecystitis without obstruction: Secondary | ICD-10-CM

## 2013-03-14 HISTORY — DX: Disease of gallbladder, unspecified: K82.9

## 2013-03-14 LAB — CBC WITH DIFFERENTIAL/PLATELET
Eosinophils Relative: 2 % (ref 0–5)
HCT: 39.5 % (ref 36.0–46.0)
Hemoglobin: 13.7 g/dL (ref 12.0–15.0)
Lymphocytes Relative: 44 % (ref 12–46)
Lymphs Abs: 3.1 10*3/uL (ref 0.7–4.0)
MCV: 86.1 fL (ref 78.0–100.0)
Monocytes Absolute: 0.5 10*3/uL (ref 0.1–1.0)
Neutro Abs: 3.2 10*3/uL (ref 1.7–7.7)
RBC: 4.59 MIL/uL (ref 3.87–5.11)
WBC: 7 10*3/uL (ref 4.0–10.5)

## 2013-03-14 LAB — URINALYSIS, ROUTINE W REFLEX MICROSCOPIC
Bilirubin Urine: NEGATIVE
Glucose, UA: NEGATIVE mg/dL
Hgb urine dipstick: NEGATIVE
Specific Gravity, Urine: 1.004 — ABNORMAL LOW (ref 1.005–1.030)
Urobilinogen, UA: 0.2 mg/dL (ref 0.0–1.0)
pH: 7.5 (ref 5.0–8.0)

## 2013-03-14 LAB — COMPREHENSIVE METABOLIC PANEL
ALT: 9 U/L (ref 0–35)
CO2: 29 mEq/L (ref 19–32)
Calcium: 9.6 mg/dL (ref 8.4–10.5)
Chloride: 104 mEq/L (ref 96–112)
Creatinine, Ser: 0.72 mg/dL (ref 0.50–1.10)
GFR calc Af Amer: >90 mL/min (ref >90)
GFR calc non Af Amer: >90 mL/min (ref >90)
Glucose, Bld: 102 mg/dL — ABNORMAL HIGH (ref 70–99)
Total Bilirubin: 0.5 mg/dL (ref 0.3–1.2)

## 2013-03-14 LAB — LIPASE, BLOOD: Lipase: 35 U/L (ref 11–59)

## 2013-03-14 NOTE — ED Notes (Signed)
PT. REPORTS RIGHT ABDOMINAL PAIN RADIATING TO BACK WITH NAUSEA ONSET THIS MORNING , PT. STATED HISTORY OF GALLBLADDER DISEASE. DENEIS EMESIS OR FEVER , NO DYSURIA OR DISCHARGE.

## 2013-03-15 ENCOUNTER — Other Ambulatory Visit (INDEPENDENT_AMBULATORY_CARE_PROVIDER_SITE_OTHER): Payer: Self-pay | Admitting: Surgery

## 2013-03-15 ENCOUNTER — Telehealth (INDEPENDENT_AMBULATORY_CARE_PROVIDER_SITE_OTHER): Payer: Self-pay | Admitting: General Surgery

## 2013-03-15 MED ORDER — OXYCODONE-ACETAMINOPHEN 5-325 MG PO TABS: 2.0000 | ORAL_TABLET | ORAL | Status: DC | PRN

## 2013-03-15 MED ORDER — SODIUM CHLORIDE 0.9 % IV BOLUS (SEPSIS): 1000.0000 mL | Freq: Once | INTRAVENOUS | Status: DC

## 2013-03-15 MED ORDER — ONDANSETRON HCL 4 MG/2ML IJ SOLN: 4.0000 mg | Freq: Once | INTRAMUSCULAR | Status: DC

## 2013-03-15 MED ORDER — MORPHINE SULFATE 4 MG/ML IJ SOLN: 4.0000 mg | Freq: Once | INTRAMUSCULAR | Status: DC

## 2013-03-15 NOTE — Telephone Encounter (Signed)
Patient's niece calling (patient does not speak english) because patient has developed more pain with her gallbladder. She was seen in the ER last night. She would now like to proceed with surgery for her gallbladder. Please advise if she needs another appt or if she can be scheduled. Kennyth Arnold (niece) can be reached back at [phone].

## 2013-03-15 NOTE — Telephone Encounter (Signed)
I don't need to see her again  I saw my note and she was supposed to have been scheduled.  Let the schedulers know to see if they still have her info.  thanks

## 2013-03-15 NOTE — ED Notes (Signed)
Discharge instructions and information reviewed and given. Patient discharged home. Ambulatory, steady gait. AAOx4, resp e/u, NAD noted at time of discharge.

## 2013-03-15 NOTE — ED Provider Notes (Signed)
History    CSN: [id number] Arrival date & time 03/14/13  2057  First MD Initiated Contact with Patient 03/14/13 2359     Chief Complaint  Patient presents with  . Abdominal Pain   (Consider location/radiation/quality/duration/timing/severity/associated sxs/prior Treatment) HPI Comments: Patient with history of known gallstones.  She has been seen by Dr. Magnus Ivan and surgery has been recommended, however has not been scheduled.  She started again with pain yesterday evening.  She denies fevers or chills.  She feels nauseated but has not vomited.    Patient is a 32 y.o. female presenting with abdominal pain. The history is provided by the patient.  Abdominal Pain This is a recurrent problem. The current episode started yesterday. The problem occurs constantly. The problem has been gradually worsening. Associated symptoms include abdominal pain. Nothing aggravates the symptoms. Nothing relieves the symptoms. She has tried nothing for the symptoms. The treatment provided no relief.   Past Medical History  Diagnosis Date  . Gallbladder disease    History reviewed. No pertinent past surgical history. Family History  Problem Relation Age of Onset  . Hypertension Mother   . Cancer Father     pancreatic   History  Substance Use Topics  . Smoking status: Never Smoker   . Smokeless tobacco: Not on file  . Alcohol Use: No   OB History   Grav Para Term Preterm Abortions TAB SAB Ect Mult Living                 Review of Systems  Gastrointestinal: Positive for abdominal pain.  All other systems reviewed and are negative.    Allergies  Review of patient's allergies indicates no known allergies.  Home Medications   Current Outpatient Rx  Name  Route  Sig  Dispense  Refill  . HYDROcodone-acetaminophen (NORCO/VICODIN) 5-325 MG per tablet   Oral   Take 1 tablet by mouth every 4 (four) hours as needed for pain.          BP 118/73  Pulse 62  Temp(Src) 97.6 F (36.4 C) (Oral)   Resp 14  SpO2 100%  LMP 03/10/2013 Physical Exam  Nursing note and vitals reviewed. Constitutional: She is oriented to person, place, and time. She appears well-developed and well-nourished. No distress.  HENT:  Head: Normocephalic and atraumatic.  Neck: Normal range of motion. Neck supple.  Cardiovascular: Normal rate, regular rhythm and normal heart sounds.   No murmur heard. Pulmonary/Chest: Effort normal and breath sounds normal. No respiratory distress. She has no wheezes.  Abdominal: Soft. Bowel sounds are normal. She exhibits no distension. There is no tenderness.  There is ttp in the right mid-abdomen and right upper quadrant.  There is no rebound or guarding.  Bowel sounds are present.  Musculoskeletal: Normal range of motion. She exhibits no edema.  Neurological: She is alert and oriented to person, place, and time.  Skin: Skin is warm and dry. She is not diaphoretic.    ED Course  Procedures (including critical care time) Labs Reviewed  URINALYSIS, ROUTINE W REFLEX MICROSCOPIC - Abnormal; Notable for the following:    Specific Gravity, Urine 1.004 (*)    All other components within normal limits  COMPREHENSIVE METABOLIC PANEL - Abnormal; Notable for the following:    Glucose, Bld 102 (*)    All other components within normal limits  LIPASE, BLOOD  CBC WITH DIFFERENTIAL  POCT PREGNANCY, URINE   No results found. No diagnosis found.  MDM  The patient presents with  abdominal pain that is likely biliary colic.  Her labs, including wbc, lfts, lipase are all unremarkable.  She was given two percocet and wants to go home and call Dr. Magnus Ivan in the morning.  She will be given a prescription for the same, to return for fever, vomiting.  Geoffery Lyons, MD 03/15/13 [phone]

## 2013-03-15 NOTE — ED Notes (Signed)
Patient requesting to be discharged. Labs WNL. Ok per Dr. Judd Lien. Rx for pain provided. Patient should f/u with Dr. Magnus Ivan in morning. Patient ok with plan and verbalizes understanding for follow up.

## 2013-03-28 ENCOUNTER — Encounter (HOSPITAL_BASED_OUTPATIENT_CLINIC_OR_DEPARTMENT_OTHER): Payer: Self-pay | Admitting: *Deleted

## 2013-04-02 NOTE — H&P (Signed)
Patient ID: [name] Wu, female DOB: 1981-05-07, 32 y.o. MRN: [id number]  Chief Complaint   Patient presents with   .  New Evaluation     eval cholelithiasis   HPI  [name] Wu is a 32 y.o. female.  HPI  She is referred by Dr. Noreene Filbert for evaluation of symptomatic cholelithiasis. She has known for at least a year that she has had gallstones. She has been having attacks of right upper quadrant abdominal pain nausea and vomiting after fatty meals for over a year. Currently today she is pain-free. Her attacks are quite frequent. Her pain to be moderate to severe in intensity. She denies jaundice. Bowel movements are normal.  Past Medical History   Diagnosis  Date   .  Gallbladder attack    History reviewed. No pertinent past surgical history.  Family History   Problem  Relation  Age of Onset   .  Hypertension  Mother    .  Cancer  Father      pancreatic   Social History  History   Substance Use Topics   .  Smoking status:  Never Smoker   .  Smokeless tobacco:  Not on file   .  Alcohol Use:  No   No Known Allergies  Current Outpatient Prescriptions   Medication  Sig  Dispense  Refill   .  pantoprazole (PROTONIX) 20 MG tablet  Take 2 tablets (40 mg total) by mouth daily.  30 tablet  0   .  polyethylene glycol powder (GLYCOLAX/MIRALAX) powder  Take 17 g by mouth daily.  3350 g  1    No current facility-administered medications for this visit.   Review of Systems  Review of Systems  Constitutional: Negative for fever, chills and unexpected weight change.  HENT: Negative for hearing loss, congestion, sore throat, trouble swallowing and voice change.  Eyes: Negative for visual disturbance.  Respiratory: Negative for cough and wheezing.  Cardiovascular: Negative for chest pain, palpitations and leg swelling.  Gastrointestinal: Positive for nausea and abdominal pain. Negative for vomiting, diarrhea, constipation, blood in stool, abdominal distention and anal  bleeding.  Genitourinary: Negative for hematuria, vaginal bleeding and difficulty urinating.  Musculoskeletal: Negative for arthralgias.  Skin: Negative for rash and wound.  Neurological: Negative for seizures, syncope and headaches.  Hematological: Negative for adenopathy. Does not bruise/bleed easily.  Psychiatric/Behavioral: Negative for confusion.  Blood pressure 120/62, pulse 60, temperature 97.4 F (36.3 C), temperature source Temporal, resp. rate 16, height 5\' 5"  (1.651 m), weight 136 lb (61.689 kg).  Physical Exam  Physical Exam  Constitutional: She appears well-nourished. No distress.  HENT:  Head: Normocephalic and atraumatic.  Right Ear: External ear normal.  Left Ear: External ear normal.  Nose: Nose normal.  Eyes: Conjunctivae are normal. Right eye exhibits no discharge. Left eye exhibits no discharge. No scleral icterus.  Neck: No tracheal deviation present. No thyromegaly present.  Cardiovascular: Normal rate, regular rhythm, normal heart sounds and intact distal pulses.  No murmur heard.  Pulmonary/Chest: Effort normal and breath sounds normal. She has no wheezes.  Abdominal: Soft. Bowel sounds are normal. She exhibits no distension. There is no tenderness.  Musculoskeletal: Normal range of motion. She exhibits no edema.  Skin: Skin is warm and dry. She is not diaphoretic.  Psychiatric: Her behavior is normal. Judgment normal.  Data Reviewed  I have reviewed her ultrasound from last year showing cholelithiasis. The bile duct is normal.  Assessment  Symptomatically Cholelithiasis  Plan  Laparoscopic cholecystectomy  with possible cholangiogram is recommended. I discussed this with the patient and her husband. I gave her literature regarding the surgery. I discussed the risks which includes but is not limited to bleeding, infection, bile duct injury, bile leak, injury to other structures, need to convert to an open procedure, et Karie Soda. She understands and wished to  proceed. Likelihood of success is good

## 2013-04-04 ENCOUNTER — Ambulatory Visit (HOSPITAL_BASED_OUTPATIENT_CLINIC_OR_DEPARTMENT_OTHER)
Admission: RE | Admit: 2013-04-04 | Discharge: 2013-04-04 | Disposition: A | Discharge status: Home / Self Care | Payer: BC Managed Care – PPO | Source: Ambulatory Visit | Attending: Surgery | Admitting: Surgery

## 2013-04-04 ENCOUNTER — Ambulatory Visit (HOSPITAL_BASED_OUTPATIENT_CLINIC_OR_DEPARTMENT_OTHER): Payer: BC Managed Care – PPO | Admitting: Certified Registered Nurse Anesthetist

## 2013-04-04 ENCOUNTER — Encounter (HOSPITAL_BASED_OUTPATIENT_CLINIC_OR_DEPARTMENT_OTHER): Payer: Self-pay | Admitting: Certified Registered Nurse Anesthetist

## 2013-04-04 ENCOUNTER — Encounter (HOSPITAL_BASED_OUTPATIENT_CLINIC_OR_DEPARTMENT_OTHER)
Admission: RE | Disposition: A | Discharge status: Home / Self Care | Payer: Self-pay | Source: Ambulatory Visit | Attending: Surgery

## 2013-04-04 DIAGNOSIS — Z79899 Other long term (current) drug therapy: Secondary | ICD-10-CM | POA: Insufficient documentation

## 2013-04-04 DIAGNOSIS — K8010 Calculus of gallbladder with chronic cholecystitis without obstruction: Secondary | ICD-10-CM

## 2013-04-04 HISTORY — PX: CHOLECYSTECTOMY: SHX55

## 2013-04-04 SURGERY — LAPAROSCOPIC CHOLECYSTECTOMY
Anesthesia: General | Site: Abdomen | Wound class: Clean

## 2013-04-04 MED ORDER — HYDROCODONE-ACETAMINOPHEN 5-325 MG PO TABS: 1.0000 | ORAL_TABLET | ORAL | Status: DC | PRN

## 2013-04-04 MED ORDER — LACTATED RINGERS IV SOLN
INTRAVENOUS | Status: DC
  Administered 2013-04-04 (×2): via INTRAVENOUS

## 2013-04-04 MED ORDER — MORPHINE SULFATE 4 MG/ML IJ SOLN: 4.0000 mg | INTRAMUSCULAR | Status: DC | PRN

## 2013-04-04 MED ORDER — OXYCODONE HCL 5 MG PO TABS: 5.0000 mg | ORAL_TABLET | ORAL | Status: DC | PRN

## 2013-04-04 MED ORDER — OXYCODONE HCL 5 MG PO TABS
5.0000 mg | ORAL_TABLET | Freq: Once | ORAL | Status: AC | PRN
  Administered 2013-04-04: 5 mg via ORAL

## 2013-04-04 MED ORDER — ONDANSETRON HCL 4 MG/2ML IJ SOLN
INTRAMUSCULAR | Status: DC | PRN
  Administered 2013-04-04: 4 mg via INTRAVENOUS

## 2013-04-04 MED ORDER — SODIUM CHLORIDE 0.9 % IJ SOLN: 3.0000 mL | INTRAMUSCULAR | Status: DC | PRN

## 2013-04-04 MED ORDER — ONDANSETRON HCL 4 MG/2ML IJ SOLN
4.0000 mg | Freq: Once | INTRAMUSCULAR | Status: AC | PRN
  Administered 2013-04-04: 4 mg via INTRAVENOUS

## 2013-04-04 MED ORDER — ACETAMINOPHEN 325 MG PO TABS: 650.0000 mg | ORAL_TABLET | ORAL | Status: DC | PRN

## 2013-04-04 MED ORDER — PROMETHAZINE HCL 25 MG/ML IJ SOLN
6.2500 mg | Freq: Once | INTRAMUSCULAR | Status: AC
  Administered 2013-04-04: 6.25 mg via INTRAVENOUS

## 2013-04-04 MED ORDER — GLYCOPYRROLATE 0.2 MG/ML IJ SOLN
INTRAMUSCULAR | Status: DC | PRN
  Administered 2013-04-04: 0.6 mg via INTRAVENOUS

## 2013-04-04 MED ORDER — ROCURONIUM BROMIDE 100 MG/10ML IV SOLN
INTRAVENOUS | Status: DC | PRN
  Administered 2013-04-04: 30 mg via INTRAVENOUS

## 2013-04-04 MED ORDER — LIDOCAINE HCL (CARDIAC) 20 MG/ML IV SOLN
INTRAVENOUS | Status: DC | PRN
  Administered 2013-04-04: 60 mg via INTRAVENOUS

## 2013-04-04 MED ORDER — OXYCODONE HCL 5 MG/5ML PO SOLN: 5.0000 mg | Freq: Once | ORAL | Status: AC | PRN

## 2013-04-04 MED ORDER — MIDAZOLAM HCL 5 MG/5ML IJ SOLN
INTRAMUSCULAR | Status: DC | PRN
  Administered 2013-04-04: 1 mg via INTRAVENOUS

## 2013-04-04 MED ORDER — NEOSTIGMINE METHYLSULFATE 1 MG/ML IJ SOLN
INTRAMUSCULAR | Status: DC | PRN
  Administered 2013-04-04: 4 mg via INTRAVENOUS

## 2013-04-04 MED ORDER — SODIUM CHLORIDE 0.9 % IR SOLN
Status: DC | PRN
  Administered 2013-04-04: 100 mL

## 2013-04-04 MED ORDER — KETOROLAC TROMETHAMINE 30 MG/ML IJ SOLN
INTRAMUSCULAR | Status: DC | PRN
  Administered 2013-04-04: 30 mg via INTRAVENOUS

## 2013-04-04 MED ORDER — SODIUM CHLORIDE 0.9 % IV SOLN: 250.0000 mL | INTRAVENOUS | Status: DC | PRN

## 2013-04-04 MED ORDER — PROPOFOL 10 MG/ML IV BOLUS
INTRAVENOUS | Status: DC | PRN
  Administered 2013-04-04: 200 mg via INTRAVENOUS

## 2013-04-04 MED ORDER — FENTANYL CITRATE 0.05 MG/ML IJ SOLN
INTRAMUSCULAR | Status: DC | PRN
  Administered 2013-04-04: 25 ug via INTRAVENOUS
  Administered 2013-04-04: 50 ug via INTRAVENOUS

## 2013-04-04 MED ORDER — BUPIVACAINE-EPINEPHRINE PF 0.5-1:200000 % IJ SOLN
INTRAMUSCULAR | Status: DC | PRN
  Administered 2013-04-04: 20 mL

## 2013-04-04 MED ORDER — MIDAZOLAM HCL 2 MG/2ML IJ SOLN: 1.0000 mg | INTRAMUSCULAR | Status: DC | PRN

## 2013-04-04 MED ORDER — HYDROMORPHONE HCL PF 1 MG/ML IJ SOLN
0.2500 mg | INTRAMUSCULAR | Status: DC | PRN
  Administered 2013-04-04 (×3): 0.5 mg via INTRAVENOUS

## 2013-04-04 MED ORDER — FENTANYL CITRATE 0.05 MG/ML IJ SOLN: 50.0000 ug | INTRAMUSCULAR | Status: DC | PRN

## 2013-04-04 MED ORDER — DEXAMETHASONE SODIUM PHOSPHATE 4 MG/ML IJ SOLN
INTRAMUSCULAR | Status: DC | PRN
  Administered 2013-04-04: 10 mg via INTRAVENOUS

## 2013-04-04 MED ORDER — ACETAMINOPHEN 650 MG RE SUPP: 650.0000 mg | RECTAL | Status: DC | PRN

## 2013-04-04 MED ORDER — ONDANSETRON HCL 4 MG/2ML IJ SOLN: 4.0000 mg | Freq: Four times a day (QID) | INTRAMUSCULAR | Status: DC | PRN

## 2013-04-04 MED ORDER — SODIUM CHLORIDE 0.9 % IJ SOLN: 3.0000 mL | Freq: Two times a day (BID) | INTRAMUSCULAR | Status: DC

## 2013-04-04 MED ORDER — CEFAZOLIN SODIUM-DEXTROSE 2-3 GM-% IV SOLR
2.0000 g | INTRAVENOUS | Status: AC
  Administered 2013-04-04: 2 g via INTRAVENOUS

## 2013-04-04 SURGICAL SUPPLY — 34 items
APPLIER CLIP 5 13 M/L LIGAMAX5 (MISCELLANEOUS) ×2
BANDAGE ADHESIVE 1X3 (GAUZE/BANDAGES/DRESSINGS) ×8 IMPLANT
BENZOIN TINCTURE PRP APPL 2/3 (GAUZE/BANDAGES/DRESSINGS) ×2 IMPLANT
BLADE SURG ROTATE 9660 (MISCELLANEOUS) IMPLANT
CANISTER SUCTION 2500CC (MISCELLANEOUS) ×2 IMPLANT
CHLORAPREP W/TINT 26ML (MISCELLANEOUS) ×2 IMPLANT
CLIP APPLIE 5 13 M/L LIGAMAX5 (MISCELLANEOUS) ×1 IMPLANT
CLOTH BEACON ORANGE TIMEOUT ST (SAFETY) ×2 IMPLANT
COVER MAYO STAND STRL (DRAPES) IMPLANT
DECANTER SPIKE VIAL GLASS SM (MISCELLANEOUS) ×2 IMPLANT
DRAPE C-ARM 42X72 X-RAY (DRAPES) IMPLANT
DRAPE UTILITY XL STRL (DRAPES) ×2 IMPLANT
ELECT REM PT RETURN 9FT ADLT (ELECTROSURGICAL) ×2
ELECTRODE REM PT RTRN 9FT ADLT (ELECTROSURGICAL) ×1 IMPLANT
FILTER SMOKE EVAC LAPAROSHD (FILTER) ×2 IMPLANT
GLOVE SURG SIGNA 7.5 PF LTX (GLOVE) ×2 IMPLANT
GOWN PREVENTION PLUS XLARGE (GOWN DISPOSABLE) ×2 IMPLANT
GOWN STRL NON-REIN LRG LVL3 (GOWN DISPOSABLE) ×6 IMPLANT
NS IRRIG 1000ML POUR BTL (IV SOLUTION) ×2 IMPLANT
PACK BASIN DAY SURGERY FS (CUSTOM PROCEDURE TRAY) ×2 IMPLANT
POUCH SPECIMEN RETRIEVAL 10MM (ENDOMECHANICALS) IMPLANT
SCISSORS LAP 5X35 DISP (ENDOMECHANICALS) IMPLANT
SET CHOLANGIOGRAPH 5 50 .035 (SET/KITS/TRAYS/PACK) IMPLANT
SET IRRIG TUBING LAPAROSCOPIC (IRRIGATION / IRRIGATOR) ×2 IMPLANT
SLEEVE ENDOPATH XCEL 5M (ENDOMECHANICALS) ×4 IMPLANT
SLEEVE SCD COMPRESS KNEE MED (MISCELLANEOUS) ×2 IMPLANT
SPECIMEN JAR SMALL (MISCELLANEOUS) ×2 IMPLANT
SUT MON AB 4-0 PC3 18 (SUTURE) ×2 IMPLANT
TOWEL OR 17X24 6PK STRL BLUE (TOWEL DISPOSABLE) ×2 IMPLANT
TOWEL OR NON WOVEN STRL DISP B (DISPOSABLE) ×2 IMPLANT
TRAY LAPAROSCOPIC (CUSTOM PROCEDURE TRAY) ×2 IMPLANT
TROCAR XCEL BLUNT TIP 100MML (ENDOMECHANICALS) ×2 IMPLANT
TROCAR XCEL NON-BLD 5MMX100MML (ENDOMECHANICALS) ×2 IMPLANT
TUBE CONNECTING 20X1/4 (TUBING) ×2 IMPLANT

## 2013-04-04 NOTE — Anesthesia Procedure Notes (Signed)
Procedure Name: Intubation Date/Time: 04/04/2013 8:17 AM Performed by: Makinzy Cleere D Pre-anesthesia Checklist: Patient identified, Emergency Drugs available, Suction available and Patient being monitored Patient Re-evaluated:Patient Re-evaluated prior to inductionOxygen Delivery Method: Circle System Utilized Preoxygenation: Pre-oxygenation with 100% oxygen Intubation Type: IV induction Ventilation: Mask ventilation without difficulty Laryngoscope Size: Mac and 3 Grade View: Grade I Tube type: Oral Tube size: 7.0 mm Number of attempts: 1 Airway Equipment and Method: oral airway and LTA kit utilized Placement Confirmation: ETT inserted through vocal cords under direct vision,  positive ETCO2 and breath sounds checked- equal and bilateral Secured at: 21 cm Tube secured with: Tape Dental Injury: Teeth and Oropharynx as per pre-operative assessment

## 2013-04-04 NOTE — Interval H&P Note (Signed)
History and Physical Interval Note: no change in H and P  04/04/2013 7:37 AM  [name] Wu  has presented today for surgery, with the diagnosis of symptomatic choloelithiasis  The various methods of treatment have been discussed with the patient and family. After consideration of risks, benefits and other options for treatment, the patient has consented to  Procedure(s): LAPAROSCOPIC CHOLECYSTECTOMY POSSIBLE IOC (N/A) as a surgical intervention .  The patient's history has been reviewed, patient examined, no change in status, stable for surgery.  I have reviewed the patient's chart and labs.  Questions were answered to the patient's satisfaction.     [name]

## 2013-04-04 NOTE — Anesthesia Postprocedure Evaluation (Signed)
  Anesthesia Post-op Note  Patient: [name] Wu  Procedure(s) Performed: Procedure(s): LAPAROSCOPIC CHOLECYSTECTOMY POSSIBLE IOC (N/A)  Patient Location: PACU  Anesthesia Type:General  Level of Consciousness: awake  Airway and Oxygen Therapy: Patient Spontanous Breathing and Patient connected to face mask oxygen  Post-op Pain: mild  Post-op Assessment: Post-op Vital signs reviewed  Post-op Vital Signs: Reviewed  Complications: No apparent anesthesia complications

## 2013-04-04 NOTE — Anesthesia Preprocedure Evaluation (Addendum)
Anesthesia Evaluation  Patient identified by MRN, date of birth, ID band Patient awake    Reviewed: Allergy & Precautions, NPO status   Airway Mallampati: I TM Distance: >3 FB Neck ROM: Full    Dental  (+) Teeth Intact and Dental Advisory Given   Pulmonary  breath sounds clear to auscultation        Cardiovascular Rhythm:Regular     Neuro/Psych    GI/Hepatic   Endo/Other    Renal/GU      Musculoskeletal   Abdominal   Peds  Hematology   Anesthesia Other Findings   Reproductive/Obstetrics                          Anesthesia Physical Anesthesia Plan  ASA: I  Anesthesia Plan: General   Post-op Pain Management:    Induction: Intravenous  Airway Management Planned: Oral ETT  Additional Equipment:   Intra-op Plan:   Post-operative Plan: Extubation in OR  Informed Consent: I have reviewed the patients History and Physical, chart, labs and discussed the procedure including the risks, benefits and alternatives for the proposed anesthesia with the patient or authorized representative who has indicated his/her understanding and acceptance.   Dental advisory given  Plan Discussed with: Anesthesiologist, CRNA and Surgeon  Anesthesia Plan Comments:         Anesthesia Quick Evaluation

## 2013-04-04 NOTE — Progress Notes (Signed)
Rito Ehrlich here to interpret for patient

## 2013-04-04 NOTE — Op Note (Signed)
Laparoscopic Cholecystectomy Procedure Note  Indications: This patient presents with symptomatic gallbladder disease and [name] undergo laparoscopic cholecystectomy.  Pre-operative Diagnosis: Calculus of gallbladder without mention of cholecystitis or obstruction  Post-operative Diagnosis: Same  Surgeon: Abigail Miyamoto A   Assistants: 0  Anesthesia: General endotracheal anesthesia  ASA Class: 1  Procedure Details  The patient was seen again in the Holding Room. The risks, benefits, complications, treatment options, and expected outcomes were discussed with the patient. The possibilities of reaction to medication, pulmonary aspiration, perforation of viscus, bleeding, recurrent infection, finding a normal gallbladder, the need for additional procedures, failure to diagnose a condition, the possible need to convert to an open procedure, and creating a complication requiring transfusion or operation were discussed with the patient. The likelihood of improving the patient's symptoms with return to their baseline status is good.  The patient and/or family concurred with the proposed plan, giving informed consent. The site of surgery properly noted. The patient was taken to Operating Room, identified as [name] Wu and the procedure verified as Laparoscopic Cholecystectomy with Intraoperative Cholangiogram. A Time Out was held and the above information confirmed.  Prior to the induction of general anesthesia, antibiotic prophylaxis was administered. General endotracheal anesthesia was then administered and tolerated well. After the induction, the abdomen was prepped with Chloraprep and draped in sterile fashion. The patient was positioned in the supine position.  Local anesthetic agent was injected into the skin near the umbilicus and an incision made. We dissected down to the abdominal fascia with blunt dissection.  The fascia was incised vertically and we entered the peritoneal cavity  bluntly.  A pursestring suture of 0-Vicryl was placed around the fascial opening.  The Hasson cannula was inserted and secured with the stay suture.  Pneumoperitoneum was then created with CO2 and tolerated well without any adverse changes in the patient's vital signs. An 11-mm port was placed in the subxiphoid position.  Two 5-mm ports were placed in the right upper quadrant. All skin incisions were infiltrated with a local anesthetic agent before making the incision and placing the trocars.   We positioned the patient in reverse Trendelenburg, tilted slightly to the patient's left.  The gallbladder was identified, the fundus grasped and retracted cephalad. Adhesions were lysed bluntly and with the electrocautery where indicated, taking care not to injure any adjacent organs or viscus. The infundibulum was grasped and retracted laterally, exposing the peritoneum overlying the triangle of Calot. This was then divided and exposed in a blunt fashion. The cystic duct was clearly identified and bluntly dissected circumferentially. A critical view of the cystic duct and cystic artery was obtained.  The cystic duct was then ligated with clips and divided. The cystic artery was, dissected free, ligated with clips and divided as well.   The gallbladder was dissected from the liver bed in retrograde fashion with the electrocautery. The gallbladder was removed and placed in an Endocatch sac. The liver bed was irrigated and inspected. Hemostasis was achieved with the electrocautery. Copious irrigation was utilized and was repeatedly aspirated until clear.  The gallbladder and Endocatch sac were then removed through the umbilical port site.  The pursestring suture was used to close the umbilical fascia.    We again inspected the right upper quadrant for hemostasis.  Pneumoperitoneum was released as we removed the trocars.  4-0 Monocryl was used to close the skin.   Benzoin, steri-strips, and clean dressings were applied.  The patient was then extubated and brought to the recovery room in  stable condition. Instrument, sponge, and needle counts were correct at closure and at the conclusion of the case.   Findings: Chronic Cholecystitis with Cholelithiasis  Estimated Blood Loss: Minimal         Drains: 0         Specimens: Gallbladder           Complications: None; patient tolerated the procedure well.         Disposition: PACU - hemodynamically stable.         Condition: stable

## 2013-04-04 NOTE — Transfer of Care (Signed)
Immediate Anesthesia Transfer of Care Note  Patient: [name] Wu  Procedure(s) Performed: Procedure(s): LAPAROSCOPIC CHOLECYSTECTOMY POSSIBLE IOC (N/A)  Patient Location: PACU  Anesthesia Type:General  Level of Consciousness: awake, alert , oriented and patient cooperative  Airway & Oxygen Therapy: Patient Spontanous Breathing and Patient connected to face mask oxygen  Post-op Assessment: Report given to PACU RN and Post -op Vital signs reviewed and stable  Post vital signs: Reviewed and stable  Complications: No apparent anesthesia complications

## 2013-04-05 ENCOUNTER — Encounter (HOSPITAL_BASED_OUTPATIENT_CLINIC_OR_DEPARTMENT_OTHER): Payer: Self-pay | Admitting: Surgery

## 2013-04-24 ENCOUNTER — Encounter (INDEPENDENT_AMBULATORY_CARE_PROVIDER_SITE_OTHER): Payer: Self-pay | Admitting: Surgery

## 2013-04-24 ENCOUNTER — Ambulatory Visit (INDEPENDENT_AMBULATORY_CARE_PROVIDER_SITE_OTHER): Payer: BC Managed Care – PPO | Admitting: Surgery

## 2013-04-24 VITALS — BP 122/68 | HR 64 | Temp 97.6°F | Resp 16 | Ht 66.0 in | Wt 135.6 lb

## 2013-04-24 DIAGNOSIS — Z09 Encounter for follow-up examination after completed treatment for conditions other than malignant neoplasm: Secondary | ICD-10-CM

## 2013-04-24 NOTE — Progress Notes (Signed)
Subjective:     Patient ID: [name] Wu, female   DOB: [birth year], 32 y.o.   MRN: [id number]  HPI She is here for her first postop visit status post laparoscopic cholecystectomy. She is doing well, eating well, and moving her bowels well. She has no complaints.  Review of Systems     Objective:   Physical Exam On exam, her incisions are well healed. The final pathology showed mild chronic cholecystitis with gallstone    Assessment:     Patient stable postop     Plan:     She may resume normal activity. I will see her back as needed

## 2013-07-18 ENCOUNTER — Encounter: Payer: Self-pay | Admitting: Family Medicine

## 2013-07-18 ENCOUNTER — Ambulatory Visit (INDEPENDENT_AMBULATORY_CARE_PROVIDER_SITE_OTHER): Payer: BC Managed Care – PPO | Admitting: Family Medicine

## 2013-07-18 VITALS — BP 120/70 | HR 88 | Temp 98.3°F | Resp 16 | Ht 64.0 in | Wt 139.0 lb

## 2013-07-18 DIAGNOSIS — D2320 Other benign neoplasm of skin of unspecified ear and external auricular canal: Secondary | ICD-10-CM

## 2013-07-18 DIAGNOSIS — B09 Unspecified viral infection characterized by skin and mucous membrane lesions: Secondary | ICD-10-CM

## 2013-07-18 DIAGNOSIS — R52 Pain, unspecified: Secondary | ICD-10-CM

## 2013-07-18 DIAGNOSIS — R21 Rash and other nonspecific skin eruption: Secondary | ICD-10-CM

## 2013-07-18 DIAGNOSIS — B349 Viral infection, unspecified: Secondary | ICD-10-CM

## 2013-07-18 DIAGNOSIS — B9789 Other viral agents as the cause of diseases classified elsewhere: Secondary | ICD-10-CM

## 2013-07-18 DIAGNOSIS — D2221 Melanocytic nevi of right ear and external auricular canal: Secondary | ICD-10-CM

## 2013-07-18 LAB — POCT CBC
Lymph, poc: 1.3 (ref 0.6–3.4)
MCHC: 31.7 g/dL — AB (ref 31.8–35.4)
MID (cbc): 0.4 (ref 0–0.9)
MPV: 9 fL (ref 0–99.8)
POC Granulocyte: 4.9 (ref 2–6.9)
POC LYMPH PERCENT: 19.3 %L (ref 10–50)
POC MID %: 6.6 %M (ref 0–12)
RDW, POC: 13.2 %

## 2013-07-18 LAB — POCT URINALYSIS DIPSTICK
Ketones, UA: 15
Leukocytes, UA: NEGATIVE
Protein, UA: 30
pH, UA: 6

## 2013-07-18 LAB — POCT UA - MICROSCOPIC ONLY
Casts, Ur, LPF, POC: NEGATIVE
Yeast, UA: NEGATIVE

## 2013-07-18 NOTE — Progress Notes (Signed)
Subjective: 32 year old lady who's been sick for several days. She has had generalized aching, headache, body aches. Her back is hurting her. She has not had any cold or cough or dysuria or nausea or vomiting or diarrhea. She does have a fine rash which itches all over her. She has a nevus on her right ear which she thinks is getting bigger and would like to have taken care of.  Objective: Alert and oriented. Speaks very little [place]. She is accompanied by her husband and 2 daughters. Her TMs are normal. She does have a almost 1 CM nevus on the right ear pinna. Her neck is supple without significant nodes. Chest clear to auscultation. Heart regular without murmurs gallops or arrhythmias. Abdomen soft without organomegaly masses or tenderness. Extremities without edema. She has a very fine red rash on her trunk and extremities which itches.    Results for orders placed in visit on 07/18/13  POCT CBC      Result Value Range   WBC 6.6  4.6 - 10.2 K/uL   Lymph, poc 1.3  0.6 - 3.4   POC LYMPH PERCENT 19.3  10 - 50 %L   MID (cbc) 0.4  0 - 0.9   POC MID % 6.6  0 - 12 %M   POC Granulocyte 4.9  2 - 6.9   Granulocyte percent 74.1  37 - 80 %G   RBC 4.79  4.04 - 5.48 M/uL   Hemoglobin 13.9  12.2 - 16.2 g/dL   HCT, POC 16.1  09.6 - 47.9 %   MCV 91.7  80 - 97 fL   MCH, POC 29.0  27 - 31.2 pg   MCHC 31.7 (*) 31.8 - 35.4 g/dL   RDW, POC 04.5     Platelet Count, POC 289  142 - 424 K/uL   MPV 9.0  0 - 99.8 fL  POCT URINALYSIS DIPSTICK      Result Value Range   Color, UA yellow     Clarity, UA clear     Glucose, UA neg     Bilirubin, UA small     Ketones, UA 15     Spec Grav, UA 1.020     Blood, UA small     pH, UA 6.0     Protein, UA 30     Urobilinogen, UA 0.2     Nitrite, UA neg     Leukocytes, UA Negative    POCT UA - MICROSCOPIC ONLY      Result Value Range   WBC, Ur, HPF, POC 0-1     RBC, urine, microscopic 2-8     Bacteria, U Microscopic trace     Mucus, UA large     Epithelial  cells, urine per micros 0-3     Crystals, Ur, HPF, POC neg     Casts, Ur, LPF, POC neg     Yeast, UA neg     Assessment: Viral syndrome with viral exanthem Itching Backache Headache and body aches Nevus right ear  Plan: Treat symptomatically with Zyrtec. I think the viral syndrome he'll right its course.  Spoke to [place] who also examined the lesion. She will remove it for me at a 104 clinic time. 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 The procedure was explained to the patient and she understands that there will be a little nick in the ear lobe after this is removed.

## 2013-07-18 NOTE — Patient Instructions (Addendum)
Take Zyrtec once daily for itching and rash  Avoid getting too hot or taking hot baths or showers  Take Tylenol or ibuprofen or or Aleve for the aching in the back and body  Return if worse at any time. I expect this to go away in the next few days.  We will contact you with an appointment to take the place off of the right ear. It will leave a little scar and a little irregularity in the shape of the ear lobe.

## 2013-07-19 ENCOUNTER — Telehealth: Payer: Self-pay | Admitting: Family Medicine

## 2013-07-19 NOTE — Progress Notes (Signed)
Left message to call back regarding scheduling appointment. °

## 2013-07-19 NOTE — Telephone Encounter (Signed)
LMOM to CB. 

## 2013-07-19 NOTE — Telephone Encounter (Signed)
Message copied by Tilman Neat on Thu Jul 19, 2013  4:24 PM ------      Message from: HOPPER, DAVID H      Created: Wed Jul 18, 2013  2:46 PM       Needs an appointment with Chelle at 104 for 30 minutes to excise nevus from right ear. We'll need a laceration  set up for this. ------

## 2013-07-31 NOTE — Telephone Encounter (Signed)
LMOM to CB. Sent unable to reach letter.

## 2013-09-04 ENCOUNTER — Ambulatory Visit (INDEPENDENT_AMBULATORY_CARE_PROVIDER_SITE_OTHER): Payer: BC Managed Care – PPO | Admitting: Physician Assistant

## 2013-09-04 ENCOUNTER — Encounter: Payer: Self-pay | Admitting: Physician Assistant

## 2013-09-04 VITALS — BP 116/60 | HR 65 | Temp 98.0°F | Resp 16 | Ht 65.0 in | Wt 142.4 lb

## 2013-09-04 DIAGNOSIS — D239 Other benign neoplasm of skin, unspecified: Secondary | ICD-10-CM

## 2013-09-04 DIAGNOSIS — D229 Melanocytic nevi, unspecified: Secondary | ICD-10-CM

## 2013-09-04 NOTE — Progress Notes (Signed)
    SUBJECTIVE: This patient presents accompanied by her husband, for removal of a mole from the RIGHT ear.  I examined this mole alongside Dr. Alwyn Ren on 07/18/2013.  She thought it may have been growing in size, and is ready to have it removed.  It is visible from the front now, and measures almost 1 cm in diameter.  OBJECTIVE: BP 116/60  Pulse 65  Temp(Src) 98 F (36.7 C) (Oral)  Resp 16  Ht 5\' 5"  (1.651 m)  Wt 142 lb 6.4 oz (64.592 kg)  BMI 23.70 kg/m2  SpO2 100%  LMP 07/11/2013 [facility] Hispanic female, A&O x 3. 0.8 cm round, hyperpigmented, raised lesion noted on the RIGHT auricle.  Most of the lesion is on the posterior aspect, but the anterior edge does extend to the front of the ear.  The edges are smooth and the color is uniform.  ASSESSMENT: Nevus, ear  PROCEDURE: Verbal consent obtained. Discussed shave vs. Elliptical excision, pros and cons of each. Local anesthesia with 2 cc 2% lidocaine plain. Sterile preparation and drape. Lesion removed by shave using 10 blade. Hemostasis achieved with pressure and Drysol topically. Bandage applied.  PLAN: Local wound care. Anticipatory guidance. RTC as needed.

## 2013-09-04 NOTE — Patient Instructions (Signed)
WOUND CARE  . Keep area clean and dry for 24 hours. Do not remove bandage, if applied. . After 24 hours, remove bandage and wash wound gently with mild soap and warm water. Reapply a new bandage after cleaning wound, if directed. . Continue daily cleansing with soap and water until stitches/staples are removed. . Do not apply any ointments or creams to the wound while stitches/staples are in place, as this may cause delayed healing. . Notify the office if you experience any of the following signs of infection: Swelling, redness, pus drainage, streaking, fever >101.0 F . Notify the office if you experience excessive bleeding that does not stop after 15-20 minutes of constant, firm pressure.   

## 2013-12-11 ENCOUNTER — Ambulatory Visit (INDEPENDENT_AMBULATORY_CARE_PROVIDER_SITE_OTHER): Payer: BC Managed Care – PPO | Admitting: Physician Assistant

## 2013-12-11 ENCOUNTER — Other Ambulatory Visit: Payer: Self-pay | Admitting: Physician Assistant

## 2013-12-11 ENCOUNTER — Encounter: Payer: Self-pay | Admitting: Physician Assistant

## 2013-12-11 VITALS — BP 112/70 | HR 63 | Temp 98.5°F | Resp 16 | Ht 65.0 in | Wt 144.0 lb

## 2013-12-11 DIAGNOSIS — Z0000 Encounter for general adult medical examination without abnormal findings: Secondary | ICD-10-CM

## 2013-12-11 DIAGNOSIS — Z124 Encounter for screening for malignant neoplasm of cervix: Secondary | ICD-10-CM

## 2013-12-11 LAB — CBC WITH DIFFERENTIAL/PLATELET
BASOS PCT: 1 % (ref 0–1)
Basophils Absolute: 0.1 10*3/uL (ref 0.0–0.1)
EOS ABS: 0.3 10*3/uL (ref 0.0–0.7)
EOS PCT: 5 % (ref 0–5)
HEMATOCRIT: 38.7 % (ref 36.0–46.0)
HEMOGLOBIN: 13.3 g/dL (ref 12.0–15.0)
Lymphocytes Relative: 32 % (ref 12–46)
Lymphs Abs: 1.6 10*3/uL (ref 0.7–4.0)
MCH: 29.4 pg (ref 26.0–34.0)
MCHC: 34.4 g/dL (ref 30.0–36.0)
MCV: 85.6 fL (ref 78.0–100.0)
MONO ABS: 0.4 10*3/uL (ref 0.1–1.0)
MONOS PCT: 7 % (ref 3–12)
Neutro Abs: 2.8 10*3/uL (ref 1.7–7.7)
Neutrophils Relative %: 55 % (ref 43–77)
Platelets: 217 10*3/uL (ref 150–400)
RBC: 4.52 MIL/uL (ref 3.87–5.11)
RDW: 13.3 % (ref 11.5–15.5)
WBC: 5 10*3/uL (ref 4.0–10.5)

## 2013-12-11 LAB — POCT URINALYSIS DIPSTICK
BILIRUBIN UA: NEGATIVE
Glucose, UA: NEGATIVE
KETONES UA: NEGATIVE
Leukocytes, UA: NEGATIVE
Nitrite, UA: NEGATIVE
PH UA: 7
PROTEIN UA: NEGATIVE
RBC UA: NEGATIVE
Spec Grav, UA: 1.02
Urobilinogen, UA: 0.2

## 2013-12-11 LAB — LIPID PANEL
CHOL/HDL RATIO: 3.6 ratio
CHOLESTEROL: 142 mg/dL (ref 0–200)
HDL: 40 mg/dL (ref >39)
LDL Cholesterol: 86 mg/dL (ref 0–99)
TRIGLYCERIDES: 82 mg/dL (ref <150)
VLDL: 16 mg/dL (ref 0–40)

## 2013-12-11 LAB — COMPLETE METABOLIC PANEL WITH GFR
ALBUMIN: 4.2 g/dL (ref 3.5–5.2)
ALK PHOS: 41 U/L (ref 39–117)
ALT: 8 U/L (ref 0–35)
AST: 10 U/L (ref 0–37)
BILIRUBIN TOTAL: 0.8 mg/dL (ref 0.2–1.2)
BUN: 10 mg/dL (ref 6–23)
CO2: 26 mEq/L (ref 19–32)
CREATININE: 0.72 mg/dL (ref 0.50–1.10)
Calcium: 8.9 mg/dL (ref 8.4–10.5)
Chloride: 105 mEq/L (ref 96–112)
GFR, Est African American: >89 mL/min
GLUCOSE: 104 mg/dL — AB (ref 70–99)
POTASSIUM: 4.2 meq/L (ref 3.5–5.3)
Sodium: 138 mEq/L (ref 135–145)
Total Protein: 6.8 g/dL (ref 6.0–8.3)

## 2013-12-11 LAB — POCT UA - MICROSCOPIC ONLY
CRYSTALS, UR, HPF, POC: NEGATIVE
Casts, Ur, LPF, POC: NEGATIVE
EPITHELIAL CELLS, URINE PER MICROSCOPY: NEGATIVE
MUCUS UA: POSITIVE
WBC, UR, HPF, POC: NEGATIVE
Yeast, UA: NEGATIVE

## 2013-12-11 LAB — TSH: TSH: 2.021 u[IU]/mL (ref 0.350–4.500)

## 2013-12-11 NOTE — Patient Instructions (Signed)
I will contact you with your lab results as soon as they are available.   If you have not heard from me in 2 weeks, please contact me.  The fastest way to get your results is to register for My Chart (see the instructions on the last page of this printout).  Keeping You Healthy  Get These Tests 1. Blood Pressure- Have your blood pressure checked once a year by your health care provider.  Normal blood pressure is 120/80. 2. Weight- Have your body mass index (BMI) calculated to screen for obesity.  BMI is measure of body fat based on height and weight.  You can also calculate your own BMI at www.nhlbisupport.com/bmi/. 3. Cholesterol- Have your cholesterol checked every 5 years starting at age 20 then yearly starting at age 45. 4. Chlamydia, HIV, and other sexually transmitted diseases- Get screened every year until age 25, then within three months of each new sexual provider. 5. Pap Smear- Every 1-5 years; discuss with your health care provider. 6. Mammogram- Every year starting at age 40  Take these medicines  Calcium with Vitamin D-Your body needs 1200 mg of Calcium each day and 800-1000 IU of Vitamin D daily.  Your body can only absorb 500 mg of Calcium at a time so Calcium must be taken in 2 or 3 divided doses throughout the day.  Multivitamin with folic acid- Once daily if it is possible for you to become pregnant.  Get these Immunizations  Gardasil-Series of three doses; prevents HPV related illness such as genital warts and cervical cancer.  Menactra-Single dose; prevents meningitis.  Tetanus shot- Every 10 years.  Flu shot-Every year.  Take these steps 1. Do not smoke-Your healthcare provider can help you quit.  For tips on how to quit go to www.smokefree.gov or call 1-800 QUITNOW. 2. Be physically active- Exercise 5 days a week for at least 30 minutes.  If you are not already physically active, start slow and gradually work up to 30 minutes of moderate physical activity.   Examples of moderate activity include walking briskly, dancing, swimming, bicycling, etc. 3. Breast Cancer- A self breast exam every month is important for early detection of breast cancer.  For more information and instruction on self breast exams, ask your healthcare provider or www.womenshealth.gov/faq/breast-self-exam.cfm. 4. Eat a healthy diet- Eat a variety of healthy foods such as fruits, vegetables, whole grains, low fat milk, low fat cheeses, yogurt, lean meats, poultry and fish, beans, nuts, tofu, etc.  For more information go to www. Thenutritionsource.org 5. Drink alcohol in moderation- Limit alcohol intake to one drink or less per day. Never drink and drive. 6. Depression- Your emotional health is as important as your physical health.  If you're feeling down or losing interest in things you normally enjoy please talk to your healthcare provider about being screened for depression. 7. Dental visit- Brush and floss your teeth twice daily; visit your dentist twice a year. 8. Eye doctor- Get an eye exam at least every 2 years. 9. Helmet use- Always wear a helmet when riding a bicycle, motorcycle, rollerblading or skateboarding. 10. Safe sex- If you may be exposed to sexually transmitted infections, use a condom. 11. Seat belts- Seat belts can save your live; always wear one. 12. Smoke/Carbon Monoxide detectors- These detectors need to be installed on the appropriate level of your home. Replace batteries at least once a year. 13. Skin cancer- When out in the sun please cover up and use sunscreen 15 SPF or higher.   14. Violence- If anyone is threatening or hurting you, please tell your healthcare provider.

## 2013-12-11 NOTE — Progress Notes (Signed)
Subjective:    Patient ID: [name] Wu, female    DOB: 11-Sep-1980, 33 y.o.   MRN: [id number]   PCP: Kennon Portela, MD  Chief Complaint  Patient presents with  . Annual Exam      Active Ambulatory Problems    Diagnosis Date Noted  . No Active Ambulatory Problems   Resolved Ambulatory Problems    Diagnosis Date Noted  . Symptomatic cholelithiasis 01/30/2013   Past Medical History  Diagnosis Date  . Gallbladder disease     Past Surgical History  Procedure Laterality Date  . Cholecystectomy N/A 04/04/2013    Procedure: LAPAROSCOPIC CHOLECYSTECTOMY POSSIBLE IOC;  Surgeon: Harl Bowie, MD;  Location: [facility];  Service: General;  Laterality: N/A;    No Known Allergies  Prior to Admission medications   Not on File    History   Social History  . Marital Status: Married    Spouse Name: Jose    Number of Children: 2  . Years of Education: 6   Occupational History  . homemaker    Social History Main Topics  . Smoking status: Never Smoker   . Smokeless tobacco: Never Used  . Alcohol Use: No  . Drug Use: No  . Sexual Activity: Yes    Partners: Male    Birth Control/ Protection: IUD   Other Topics Concern  . None   Social History Narrative   Lives with her husband and their 2 daughters. From [place]. Came to the [place] in 2010.        family history includes Cancer in her father; Hypertension in her mother. indicated that her mother is alive. She indicated that her father is deceased. She indicated that all of her three sisters are alive. She indicated that all of her six brothers are alive. She indicated that her maternal grandmother is deceased. She indicated that her maternal grandfather is deceased. She indicated that her paternal grandmother is deceased. She indicated that her paternal grandfather is deceased. She indicated that both of her daughters are alive.   HPI  Presents for annual wellness exam.  Review of  Systems  Constitutional: Negative.   HENT: Negative.   Eyes: Positive for pain, discharge, redness, itching and visual disturbance.       The patient reports only a little bit of problem with vision, small letters and far away. She reports to me that she does not have eye pain, drainage, redness, itching.  Respiratory: Negative.   Cardiovascular: Negative.   Gastrointestinal: Negative.   Endocrine: Negative.   Genitourinary: Negative.   Musculoskeletal: Negative.   Skin: Negative.   Allergic/Immunologic: Negative.   Neurological: Negative.   Hematological: Negative.   Psychiatric/Behavioral: Negative.        Objective:   Physical Exam  Vitals reviewed. Constitutional: She is oriented to person, place, and time. Vital signs are normal. She appears well-developed and well-nourished. She is active and cooperative. No distress.  HENT:  Head: Normocephalic and atraumatic.  Right Ear: Hearing, tympanic membrane, external ear and ear canal normal. No foreign bodies.  Left Ear: Hearing, tympanic membrane, external ear and ear canal normal. No foreign bodies.  Nose: Nose normal.  Mouth/Throat: Uvula is midline, oropharynx is clear and moist and mucous membranes are normal. No oral lesions. Normal dentition. No dental abscesses or uvula swelling. No oropharyngeal exudate.  Eyes: Conjunctivae, EOM and lids are normal. Pupils are equal, round, and reactive to light. Right eye exhibits no discharge. Left eye  exhibits no discharge. No scleral icterus.  Fundoscopic exam:      The right eye shows no arteriolar narrowing, no AV nicking, no exudate, no hemorrhage and no papilledema.       The left eye shows no arteriolar narrowing, no AV nicking, no exudate, no hemorrhage and no papilledema.  Neck: Trachea normal, normal range of motion and full passive range of motion without pain. Neck supple. No spinous process tenderness and no muscular tenderness present. No mass and no thyromegaly present.    Cardiovascular: Normal rate, regular rhythm, normal heart sounds, intact distal pulses and normal pulses.   Pulmonary/Chest: Effort normal and breath sounds normal. She exhibits no tenderness and no retraction. Right breast exhibits no inverted nipple, no mass, no nipple discharge, no skin change and no tenderness. Left breast exhibits no inverted nipple, no mass, no nipple discharge, no skin change and no tenderness. Breasts are symmetrical.  Abdominal: Soft. Normal appearance and bowel sounds are normal. She exhibits no distension and no mass. There is no hepatosplenomegaly. There is no tenderness. There is no rigidity, no rebound, no guarding, no CVA tenderness, no tenderness at McBurney's point and negative Murphy's sign. No hernia. Hernia confirmed negative in the right inguinal area and confirmed negative in the left inguinal area.  Genitourinary: Rectum normal, vagina normal and uterus normal. Rectal exam shows no external hemorrhoid and no fissure. No breast swelling, tenderness, discharge or bleeding. Pelvic exam was performed with patient supine. No labial fusion. There is no rash, tenderness, lesion or injury on the right labia. There is no rash, tenderness, lesion or injury on the left labia. Cervix exhibits no motion tenderness, no discharge and no friability. Right adnexum displays no mass, no tenderness and no fullness. Left adnexum displays no mass, no tenderness and no fullness. No erythema, tenderness or bleeding around the vagina. No foreign body around the vagina. No signs of injury around the vagina. No vaginal discharge found.  Musculoskeletal: She exhibits no edema and no tenderness.       Cervical back: Normal.       Thoracic back: Normal.       Lumbar back: Normal.  Lymphadenopathy:       Head (right side): No tonsillar, no preauricular, no posterior auricular and no occipital adenopathy present.       Head (left side): No tonsillar, no preauricular, no posterior auricular and no  occipital adenopathy present.    She has no cervical adenopathy.    She has no axillary adenopathy.       Right: No inguinal and no supraclavicular adenopathy present.       Left: No inguinal and no supraclavicular adenopathy present.  Neurological: She is alert and oriented to person, place, and time. She has normal strength and normal reflexes. No cranial nerve deficit. She exhibits normal muscle tone. Coordination and gait normal.  Skin: Skin is warm, dry and intact. No rash noted. She is not diaphoretic. No cyanosis or erythema. Nails show no clubbing.  Psychiatric: She has a normal mood and affect. Her speech is normal and behavior is normal. Judgment and thought content normal.   Results for orders placed in visit on 12/11/13  POCT UA - MICROSCOPIC ONLY      Result Value Ref Range   WBC, Ur, HPF, POC neg     RBC, urine, microscopic 0-1     Bacteria, U Microscopic 1+     Mucus, UA pos     Epithelial cells, urine per micros  neg     Crystals, Ur, HPF, POC neg     Casts, Ur, LPF, POC neg     Yeast, UA neg    POCT URINALYSIS DIPSTICK      Result Value Ref Range   Color, UA yellow     Clarity, UA clear     Glucose, UA neg     Bilirubin, UA neg     Ketones, UA neg     Spec Grav, UA 1.020     Blood, UA neg     pH, UA 7.0     Protein, UA neg     Urobilinogen, UA 0.2     Nitrite, UA neg     Leukocytes, UA Negative            Assessment & Plan:  1. Routine general medical examination at a health care facility Age appropriate anticipatory guidance provided. - POCT UA - Microscopic Only - POCT urinalysis dipstick - CBC with Differential - COMPLETE METABOLIC PANEL WITH GFR - Lipid panel - TSH - Pap IG and HPV (high risk) DNA detection  2. Screening for cervical cancer If cytology and HPV both normal, repeat both in 5 years. - Pap IG and HPV (high risk) DNA detection   Fara Chute, PA-C Physician Assistant-Certified [facility]

## 2013-12-12 LAB — PAP IG AND HPV HIGH-RISK: HPV DNA HIGH RISK: NOT DETECTED

## 2013-12-14 LAB — HEMOGLOBIN A1C
Hgb A1c MFr Bld: 5.6 % (ref <5.7)
Mean Plasma Glucose: 114 mg/dL (ref <117)

## 2013-12-15 ENCOUNTER — Encounter: Payer: Self-pay | Admitting: Physician Assistant

## 2014-03-28 IMAGING — US US ABDOMEN COMPLETE
1 series · 14 of 25 positions shown · non-contrast
Comparison: None.

CLINICAL DATA: Upper abdominal pain.

COMPLETE ABDOMINAL ULTRASOUND

[Series 1: us abdomen complete · 0.26mm/px · 14 of 108 slices shown]
[im 1/108]
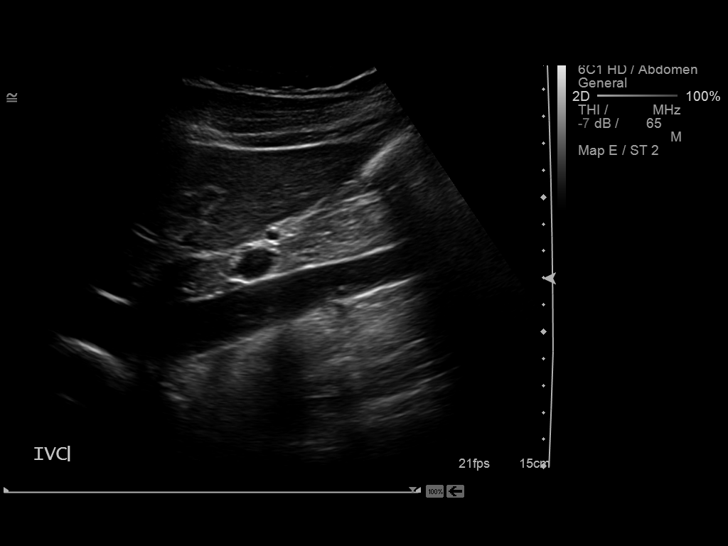
[im 9/108]
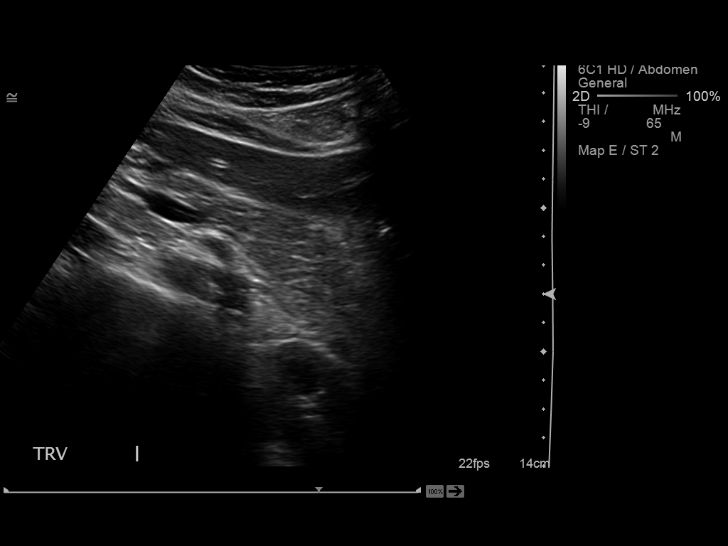
[im 18/108]
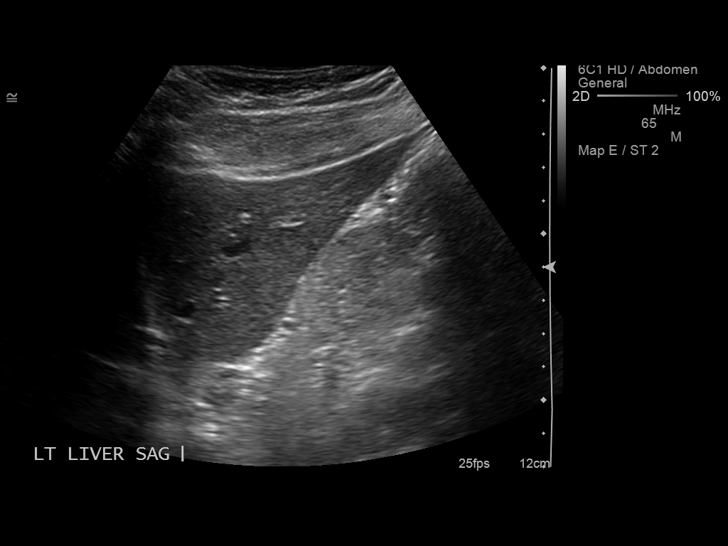
[im 27/108]
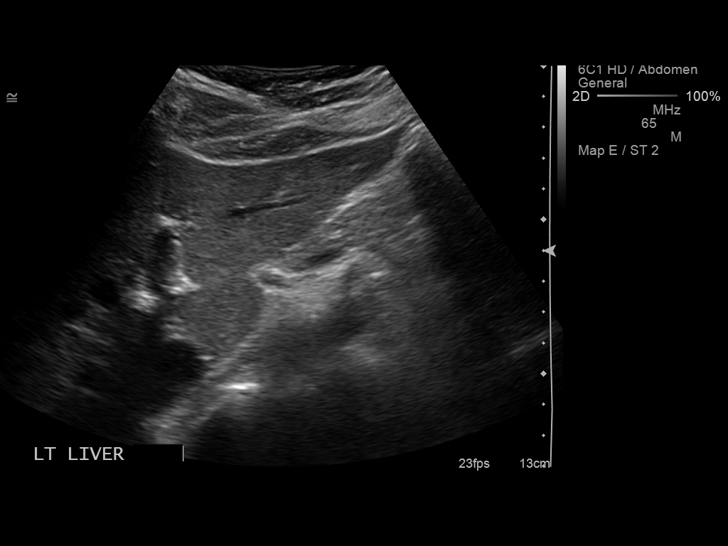
[im 36/108]
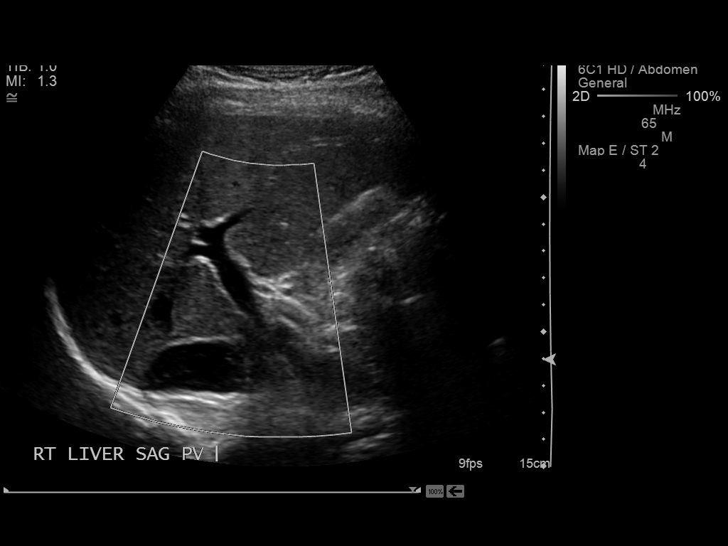
[im 41/108]
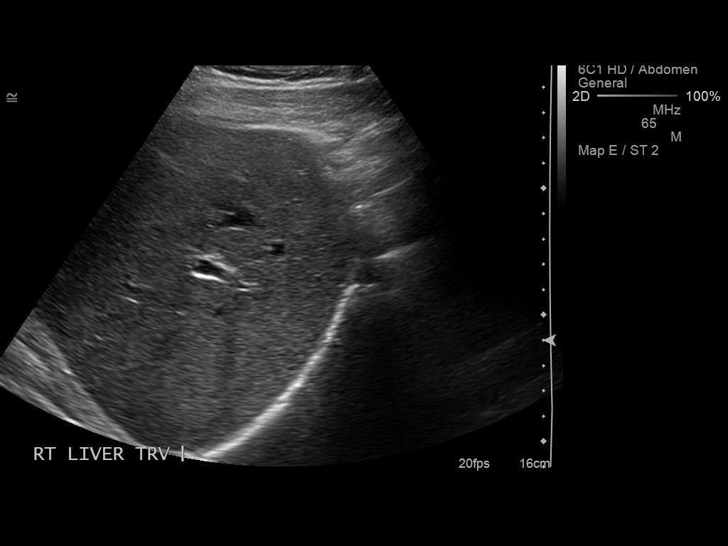
[im 50/108]
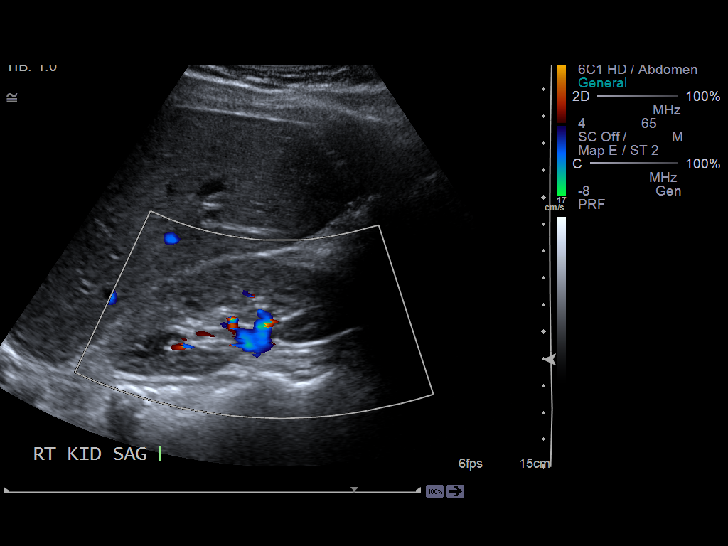
[im 58/108]
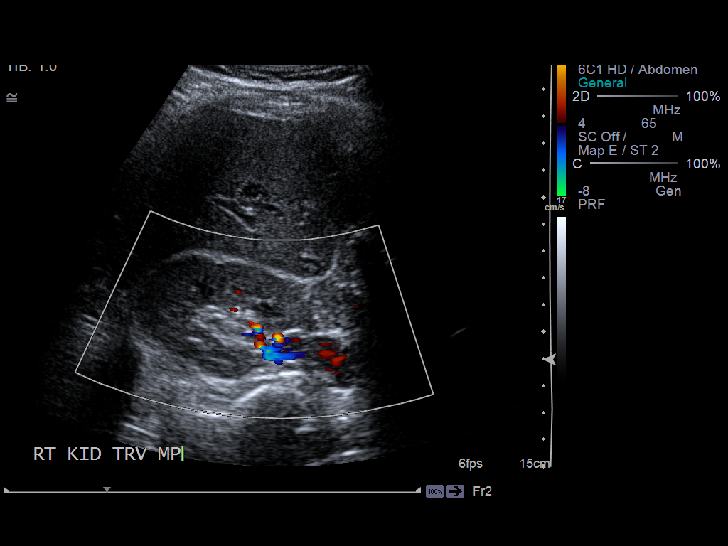
[im 67/108]
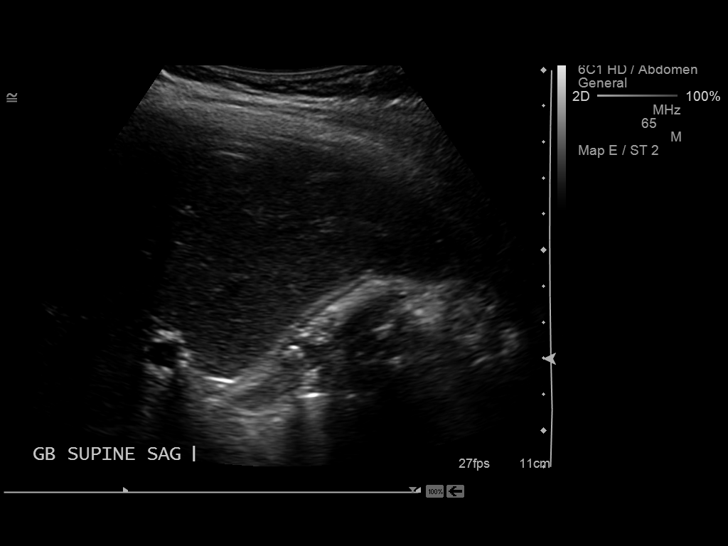
[im 72/108]
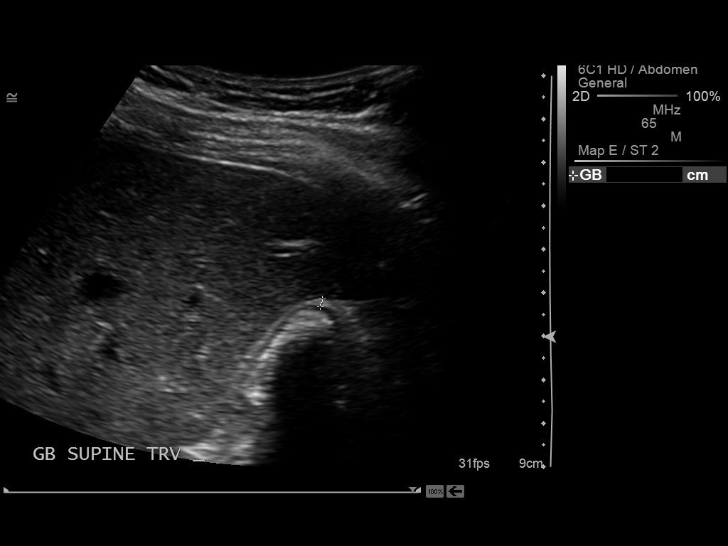
[im 81/108]
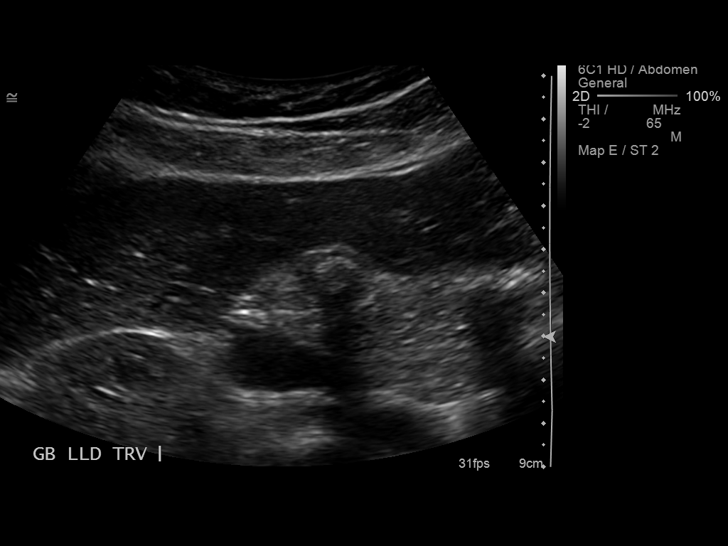
[im 90/108]
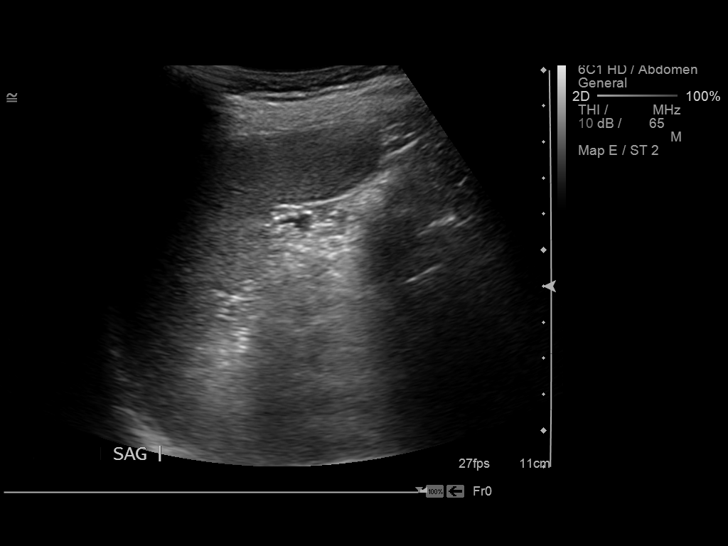
[im 99/108]
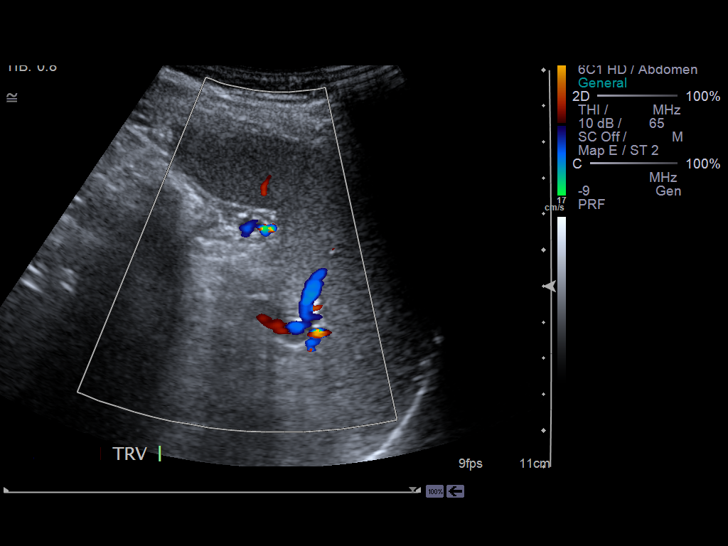
[im 108/108]
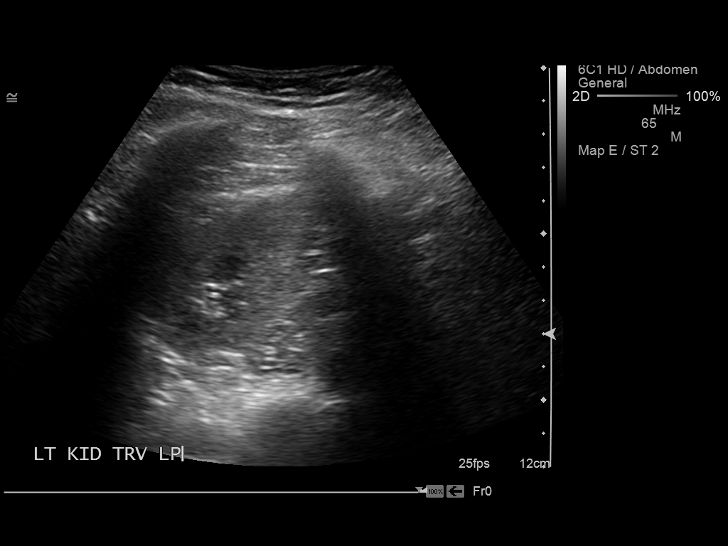

[14 of 25 positions shown; findings below may reference images not displayed]

FINDINGS: Gallbladder:  Gallbladder is filled with gallstones.  No wall
thickening.  Negative sonographic Luciene.

Common bile duct:   Normal caliber, 6 mm.

Liver:  No focal lesion identified.  Within normal limits in
parenchymal echogenicity.

IVC:  Appears normal.

Pancreas:  No focal abnormality seen.

Spleen:  Within normal limits in size and echotexture.

Right Kidney:   Normal in size and parenchymal echogenicity.  No
evidence of mass or hydronephrosis.

Left Kidney:  Normal in size and parenchymal echogenicity.  No
evidence of mass or hydronephrosis.

Abdominal aorta:  No aneurysm identified.
IMPRESSION: Gallbladder is filled with gallstones.  No changes of acute
cholecystitis.

## 2014-03-28 IMAGING — CR DG CHEST 2V
2 series · 2 of 2 positions shown · non-contrast
Comparison: None

CLINICAL DATA: Chest pain

CHEST - 2 VIEW

[view not recorded (1 of 2)]
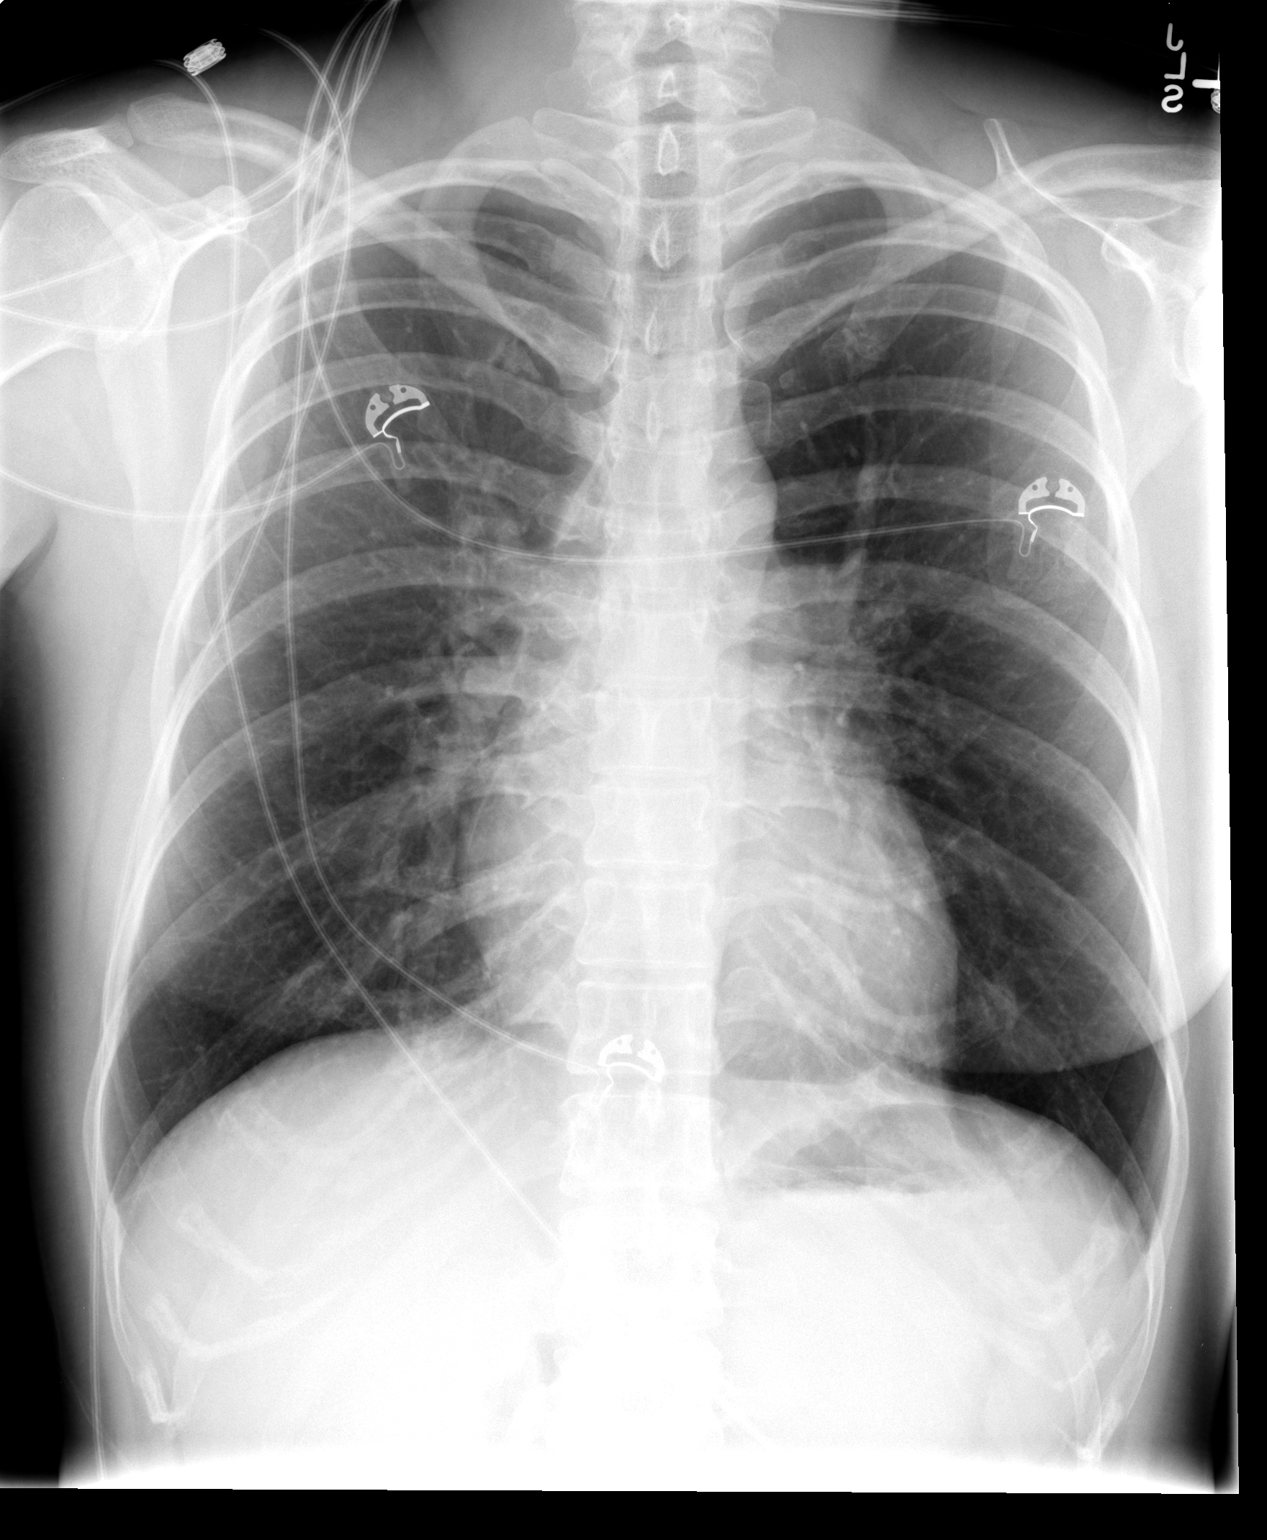

[view not recorded (2 of 2)]
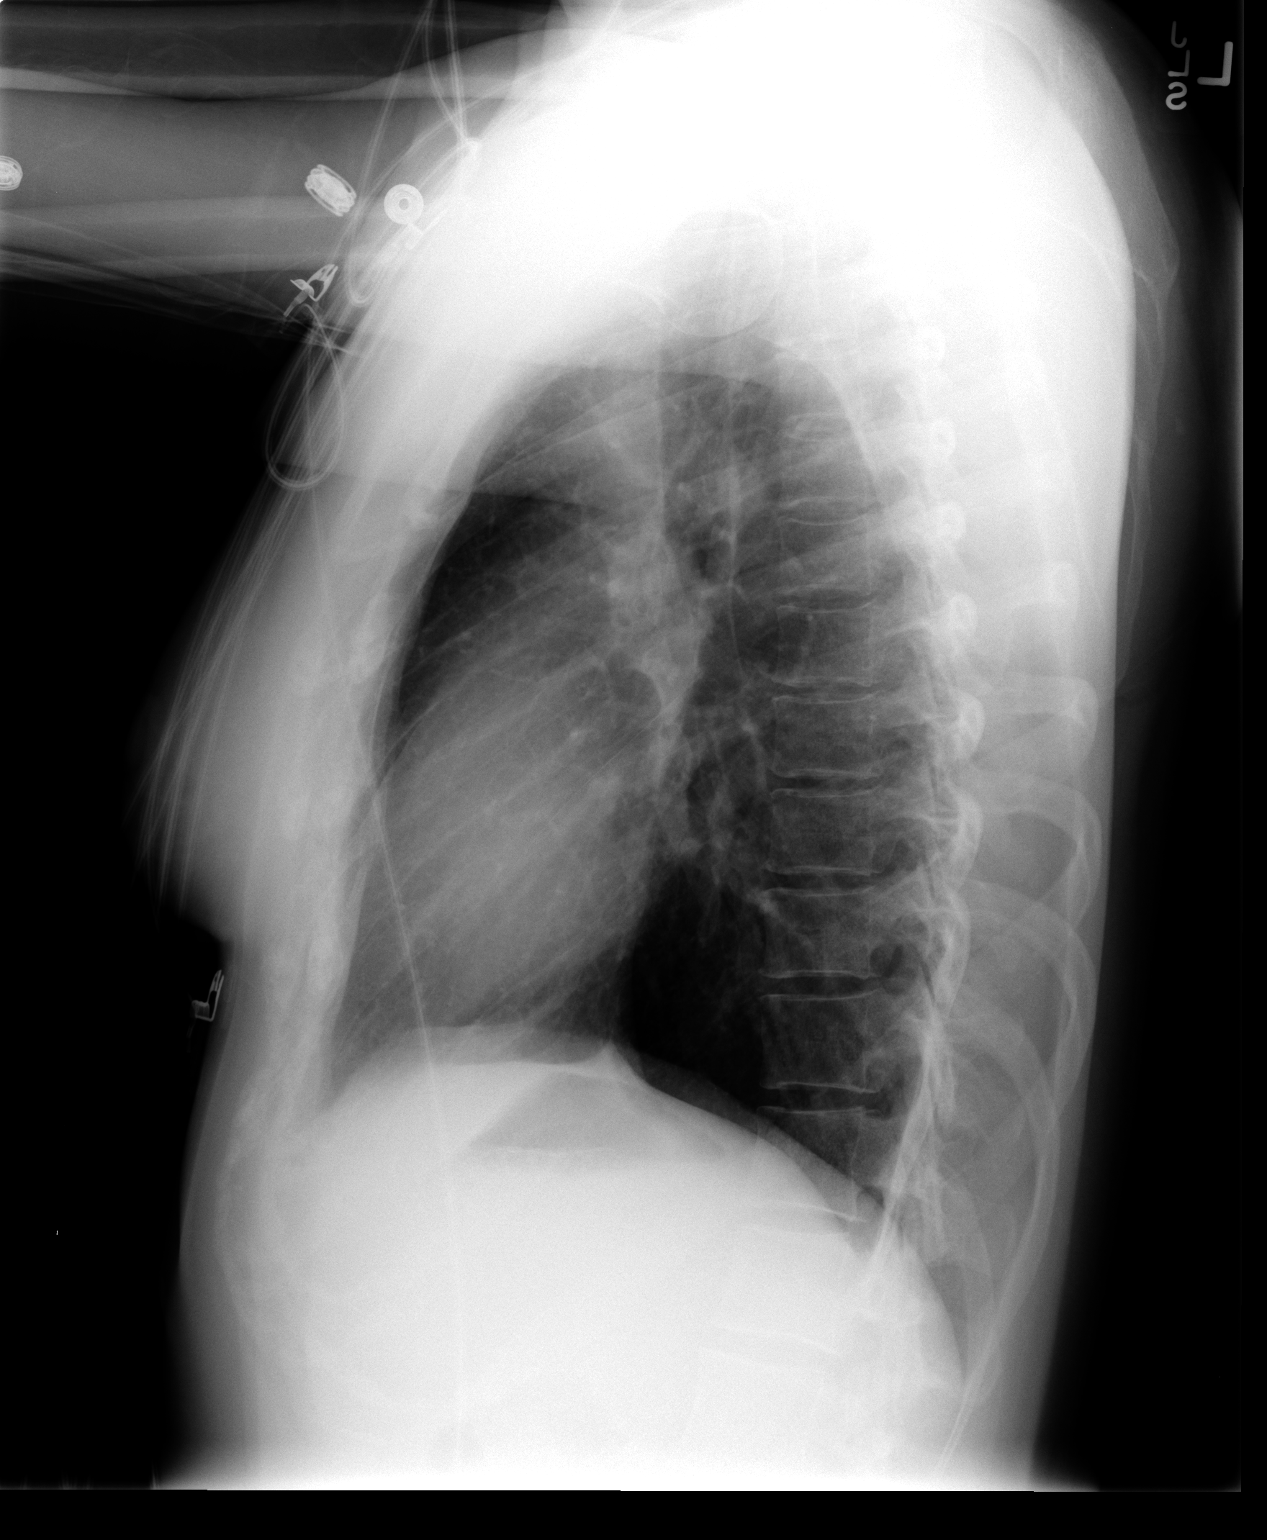

[2 of 2 positions shown; findings below may reference images not displayed]

FINDINGS: Upper-normal size of cardiac silhouette.
Mediastinal contours and pulmonary vascularity normal.
Numerous cardiac monitoring leads project over chest.
Lungs appear slightly hyperaerated but clear.
No pleural effusion or pneumothorax.
No acute osseous findings.
IMPRESSION: Slightly hyperinflated lungs.
Otherwise negative exam.

## 2016-09-06 NOTE — L&D Delivery Note (Signed)
Delivery Note At 12:35 PM a healthy female was delivered via Vaginal, Spontaneous Delivery (Presentation: LOA;  ).  APGAR: 9, 9; weight 6 lb 13 oz (3090 g).   Placenta status:intact , .  Cord:3 vessel cord  No complications  Anesthesia:  epidural Episiotomy: None Lacerations: 1st degree Suture Repair: 3.0 vicryl Est. Blood Loss (mL): 50  Mom to postpartum.  Baby to Couplet care / Skin to Skin.  Sela Hua 04/04/2017, 2:48 PM

## 2016-10-12 ENCOUNTER — Ambulatory Visit (INDEPENDENT_AMBULATORY_CARE_PROVIDER_SITE_OTHER): Payer: Medicaid Other | Admitting: Adult Health

## 2016-10-12 ENCOUNTER — Encounter: Payer: Self-pay | Admitting: Adult Health

## 2016-10-12 VITALS — BP 120/68 | HR 70 | Ht 64.0 in | Wt 162.0 lb

## 2016-10-12 DIAGNOSIS — N926 Irregular menstruation, unspecified: Secondary | ICD-10-CM

## 2016-10-12 DIAGNOSIS — Z3201 Encounter for pregnancy test, result positive: Secondary | ICD-10-CM | POA: Diagnosis not present

## 2016-10-12 DIAGNOSIS — R112 Nausea with vomiting, unspecified: Secondary | ICD-10-CM | POA: Diagnosis not present

## 2016-10-12 DIAGNOSIS — O09521 Supervision of elderly multigravida, first trimester: Secondary | ICD-10-CM

## 2016-10-12 DIAGNOSIS — O219 Vomiting of pregnancy, unspecified: Secondary | ICD-10-CM

## 2016-10-12 DIAGNOSIS — O3680X0 Pregnancy with inconclusive fetal viability, not applicable or unspecified: Secondary | ICD-10-CM

## 2016-10-12 DIAGNOSIS — Z3490 Encounter for supervision of normal pregnancy, unspecified, unspecified trimester: Secondary | ICD-10-CM

## 2016-10-12 LAB — POCT URINE PREGNANCY: Preg Test, Ur: POSITIVE — AB

## 2016-10-12 MED ORDER — PROMETHAZINE HCL 25 MG PO TABS: 25.0000 mg | ORAL_TABLET | Freq: Four times a day (QID) | ORAL | 1 refills | Status: DC | PRN

## 2016-10-12 NOTE — Progress Notes (Signed)
Subjective:     Patient ID: [name] Wu, female   DOB: 1980-12-11, 36 y.o.   MRN: [id number]  HPI [name] Wu is a 36 year old Hispanic female in with interpretor for UPT.Has missed a period and had nausea and vomiting and some reflux.  Review of Systems +missed period + nausea and vomiting and some reflux Reviewed past medical,surgical, social and family history. Reviewed medications and allergies.     Objective:   Physical Exam BP 120/68 (BP Location: Left Arm, Patient Position: Sitting, Cuff Size: Normal)   Pulse 70   Ht 5\' 4"  (1.626 m)   Wt 162 lb (73.5 kg)   LMP 07/11/2016   BMI 27.81 kg/m UPT + about 13+2 weeks by LMP, with EDD 04/17/17, Skin warm and dry. Neck: mid line trachea, normal thyroid, good ROM, no lymphadenopathy noted. Lungs: clear to ausculation bilaterally. Cardiovascular: regular rate and rhythm.Abdomen is soft and non tender. FHR 155 via doppler. PHQ 2 score 1. Will rx phenergan and get in for dating [place] and intake.    Assessment:     1. Pregnancy examination or test, positive result   2. Pregnancy, unspecified gestational age   36. Nausea and vomiting during pregnancy   4. Encounter to determine fetal viability of pregnancy, single or unspecified fetus   5. Elderly multigravida in first trimester       Plan:       Meds ordered this encounter  Medications  . Prenatal Vit-Fe Fumarate-FA (PRENATAL VITAMIN PO)    Sig: Take by mouth daily. With Folic Acid  . promethazine (PHENERGAN) 25 MG tablet    Sig: Take 1 tablet (25 mg total) by mouth every 6 (six) hours as needed for nausea or vomiting.    Dispense:  30 tablet    Refill:  1    Order Specific Question:   Supervising Provider    Answer:   Arletha Grippe  Ok to take TUMS Return in 1 week for dating [place] and intake Review handout on second trimester

## 2016-10-12 NOTE — Patient Instructions (Signed)

## 2016-10-20 ENCOUNTER — Ambulatory Visit: Payer: Medicaid Other | Admitting: *Deleted

## 2016-10-20 ENCOUNTER — Other Ambulatory Visit: Payer: Self-pay | Admitting: Adult Health

## 2016-10-20 ENCOUNTER — Ambulatory Visit (INDEPENDENT_AMBULATORY_CARE_PROVIDER_SITE_OTHER): Payer: Medicaid Other

## 2016-10-20 DIAGNOSIS — Z3A14 14 weeks gestation of pregnancy: Secondary | ICD-10-CM | POA: Diagnosis not present

## 2016-10-20 DIAGNOSIS — O3680X0 Pregnancy with inconclusive fetal viability, not applicable or unspecified: Secondary | ICD-10-CM

## 2016-10-20 NOTE — Progress Notes (Signed)
[place] 14+3 wks,single IUP,pos fht 152 bpm,efw 101 g,normal ov's bilat,post pl gr 0,EDD BY LMP 04/17/2017

## 2016-11-01 ENCOUNTER — Ambulatory Visit: Payer: Medicaid Other | Admitting: *Deleted

## 2016-11-02 ENCOUNTER — Encounter: Payer: Self-pay | Admitting: Advanced Practice Midwife

## 2016-11-02 ENCOUNTER — Ambulatory Visit: Payer: Medicaid Other | Admitting: *Deleted

## 2016-11-02 ENCOUNTER — Ambulatory Visit (INDEPENDENT_AMBULATORY_CARE_PROVIDER_SITE_OTHER): Payer: Medicaid Other | Admitting: Advanced Practice Midwife

## 2016-11-02 VITALS — BP 110/80 | HR 80 | Wt 162.0 lb

## 2016-11-02 DIAGNOSIS — O0991 Supervision of high risk pregnancy, unspecified, first trimester: Secondary | ICD-10-CM | POA: Diagnosis not present

## 2016-11-02 DIAGNOSIS — O09521 Supervision of elderly multigravida, first trimester: Secondary | ICD-10-CM | POA: Diagnosis not present

## 2016-11-02 DIAGNOSIS — Z331 Pregnant state, incidental: Secondary | ICD-10-CM

## 2016-11-02 DIAGNOSIS — Z23 Encounter for immunization: Secondary | ICD-10-CM

## 2016-11-02 DIAGNOSIS — Z3A16 16 weeks gestation of pregnancy: Secondary | ICD-10-CM | POA: Diagnosis not present

## 2016-11-02 DIAGNOSIS — Z3490 Encounter for supervision of normal pregnancy, unspecified, unspecified trimester: Secondary | ICD-10-CM | POA: Insufficient documentation

## 2016-11-02 DIAGNOSIS — Z3482 Encounter for supervision of other normal pregnancy, second trimester: Secondary | ICD-10-CM

## 2016-11-02 DIAGNOSIS — Z1389 Encounter for screening for other disorder: Secondary | ICD-10-CM | POA: Diagnosis not present

## 2016-11-02 DIAGNOSIS — Z363 Encounter for antenatal screening for malformations: Secondary | ICD-10-CM

## 2016-11-02 LAB — POCT URINALYSIS DIPSTICK
Blood, UA: NEGATIVE
GLUCOSE UA: NEGATIVE
Ketones, UA: NEGATIVE
Leukocytes, UA: NEGATIVE
NITRITE UA: NEGATIVE
PROTEIN UA: NEGATIVE

## 2016-11-02 NOTE — Patient Instructions (Signed)
Segundo trimestre de embarazo (Second Trimester of Pregnancy) El segundo trimestre va desde la semana13 hasta la 28, desde el cuarto hasta el sexto mes, y suele ser el momento en el que mejor se siente. Su organismo se ha adaptado a estar embarazada y comienza a sentirse fsicamente mejor. En general, las nuseas matutinas han disminuido o han desaparecido completamente, puede tener ms energa y un aumento de apetito. El segundo trimestre es tambin la poca en la que el feto se desarrolla rpidamente. Hacia el final del sexto mes, el feto mide aproximadamente 9pulgadas (23cm) y pesa alrededor de 1 libras (700g). Es probable que sienta que el beb se mueve (da pataditas) entre las 18 y 20semanas del embarazo. CAMBIOS EN EL ORGANISMO Su organismo atraviesa por muchos cambios durante el embarazo, y estos varan de una mujer a otra.  Seguir aumentando de peso. Notar que la parte baja del abdomen sobresale.  Podrn aparecer las primeras estras en las caderas, el abdomen y las mamas.  Es posible que tenga dolores de cabeza que pueden aliviarse con los medicamentos que el mdico le permita tomar.  Tal vez tenga necesidad de orinar con ms frecuencia porque el feto est ejerciendo presin sobre la vejiga.  Debido al embarazo podr sentir acidez estomacal con frecuencia.  Puede estar estreida, ya que ciertas hormonas enlentecen los movimientos de los msculos que empujan los desechos a travs de los intestinos.  Pueden aparecer hemorroides o abultarse e hincharse las venas (venas varicosas).  Puede tener dolor de espalda que se debe al aumento de peso y a que las hormonas del embarazo relajan las articulaciones entre los huesos de la pelvis, y como consecuencia de la modificacin del peso y los msculos que mantienen el equilibrio.  Las mamas seguirn creciendo y le dolern.  Las encas pueden sangrar y estar sensibles al cepillado y al hilo dental.  Pueden aparecer zonas oscuras o  manchas (cloasma, mscara del embarazo) en el rostro que probablemente se atenuar despus del nacimiento del beb.  Es posible que se forme una lnea oscura desde el ombligo hasta la zona del pubis (linea nigra) que probablemente se atenuar despus del nacimiento del beb.  Tal vez haya cambios en el cabello que pueden incluir su engrosamiento, crecimiento rpido y cambios en la textura. Adems, a algunas mujeres se les cae el cabello durante o despus del embarazo, o tienen el cabello seco o fino. Lo ms probable es que el cabello se le normalice despus del nacimiento del beb. QU DEBE ESPERAR EN LAS CONSULTAS PRENATALES Durante una visita prenatal de rutina:  La pesarn para asegurarse de que usted y el feto estn creciendo normalmente.  Le tomarn la presin arterial.  Le medirn el abdomen para controlar el desarrollo del beb.  Se escucharn los latidos cardacos fetales.  Se evaluarn los resultados de los estudios solicitados en visitas anteriores. El mdico puede preguntarle lo siguiente:  Cmo se siente.  Si siente los movimientos del beb.  Si ha tenido sntomas anormales, como prdida de lquido, sangrado, dolores de cabeza intensos o clicos abdominales.  Si est consumiendo algn producto que contenga tabaco, como cigarrillos, tabaco de mascar y cigarrillos electrnicos.  Si tiene alguna pregunta. Otros estudios que podrn realizarse durante el segundo trimestre incluyen lo siguiente:  Anlisis de sangre para detectar lo siguiente: ? Concentraciones de hierro bajas (anemia). ? Diabetes gestacional (entre la semana 24 y la 28). ? Anticuerpos Rh.  Anlisis de orina para detectar infecciones, diabetes o protenas en la orina.    Una ecografa para confirmar que el beb crece y se desarrolla correctamente.  Una amniocentesis para diagnosticar posibles problemas genticos.  Estudios del feto para descartar espina bfida y sndrome de Down.  Prueba del VIH (virus  de inmunodeficiencia humana). Los exmenes prenatales de rutina incluyen la prueba de deteccin del VIH, a menos que decida no realizrsela. INSTRUCCIONES PARA EL CUIDADO EN EL HOGAR  Evite fumar, consumir hierbas, beber alcohol y tomar frmacos que no le hayan recetado. Estas sustancias qumicas afectan la formacin y el desarrollo del beb.  No consuma ningn producto que contenga tabaco, lo que incluye cigarrillos, tabaco de mascar y cigarrillos electrnicos. Si necesita ayuda para dejar de fumar, consulte al mdico. Puede recibir asesoramiento y otro tipo de recursos para dejar de fumar.  Siga las indicaciones del mdico en relacin con el uso de medicamentos. Durante el embarazo, hay medicamentos que son seguros de tomar y otros que no.  Haga ejercicio solamente como se lo haya indicado el mdico. Sentir clicos uterinos es un buen signo para detener la actividad fsica.  Contine comiendo alimentos sanos con regularidad.  Use un sostn que le brinde buen soporte si le duelen las mamas.  No se d baos de inmersin en agua caliente, baos turcos ni saunas.  Use el cinturn de seguridad en todo momento mientras conduce.  No coma carne cruda ni queso sin cocinar; evite el contacto con las bandejas sanitarias de los gatos y la tierra que estos animales usan. Estos elementos contienen grmenes que pueden causar defectos congnitos en el beb.  Tome las vitaminas prenatales.  Tome entre 1500 y 2000mg de calcio diariamente comenzando en la semana20 del embarazo hasta el parto.  Si est estreida, pruebe un laxante suave (si el mdico lo autoriza). Consuma ms alimentos ricos en fibra, como vegetales y frutas frescos y cereales integrales. Beba gran cantidad de lquido para mantener la orina de tono claro o color amarillo plido.  Dese baos de asiento con agua tibia para aliviar el dolor o las molestias causadas por las hemorroides. Use una crema para las hemorroides si el mdico la  autoriza.  Si tiene venas varicosas, use medias de descanso. Eleve los pies durante 15minutos, 3 o 4veces por da. Limite el consumo de sal en su dieta.  No levante objetos pesados, use zapatos de tacones bajos y mantenga una buena postura.  Descanse con las piernas elevadas si tiene calambres o dolor de cintura.  Visite a su dentista si an no lo ha hecho durante el embarazo. Use un cepillo de dientes blando para higienizarse los dientes y psese el hilo dental con suavidad.  Puede seguir manteniendo relaciones sexuales, a menos que el mdico le indique lo contrario.  Concurra a todas las visitas prenatales segn las indicaciones de su mdico.  SOLICITE ATENCIN MDICA SI:  Tiene mareos.  Siente clicos leves, presin en la pelvis o dolor persistente en el abdomen.  Tiene nuseas, vmitos o diarrea persistentes.  Observa una secrecin vaginal con mal olor.  Siente dolor al orinar.  SOLICITE ATENCIN MDICA DE INMEDIATO SI:  Tiene fiebre.  Tiene una prdida de lquido por la vagina.  Tiene sangrado o pequeas prdidas vaginales.  Siente dolor intenso o clicos en el abdomen.  Sube o baja de peso rpidamente.  Tiene dificultad para respirar y siente dolor de pecho.  Sbitamente se le hinchan mucho el rostro, las manos, los tobillos, los pies o las piernas.  No ha sentido los movimientos del beb durante una hora.    dolor de cabeza intenso que no se alivia con medicamentos.  Su visin se modifica. Esta informacin no tiene Marine scientist el consejo del mdico. Asegrese de hacerle al mdico cualquier pregunta que tenga. Document Released: 06/02/2005 Document Revised: 09/13/2014 Document Reviewed: 10/24/2012 Elsevier Interactive Patient Education  2017 [place] beb durante el [place] (How a Baby Grows During Pregnancy) El embarazo comienza cuando el semen de un hombre ingresa al vulo de una mujer (fecundacin). Esto ocurre en una  de las trompas de [place] que [employer] ovarios con el tero. Al vulo fecundado se lo denomina embrin hasta que [employer]. A partir de las 10semanas y [place] momento del parto, se llama feto. El vulo fecundado se desplaza por la trompa de [employer] llegar al tero y luego se implanta en el endometrio y Paediatric nurse. El feto en crecimiento recibe oxgeno y nutrientes a travs del torrente sanguneo de la embarazada y de los tejidos que se forman (placenta) para la sustentacin fetal. La placenta es el sistema de sustentacin de la vida del feto, proporciona la nutricin y [employer] Wholes[employer]. Informarse tanto como pueda sobre el embarazo y la forma en que se desarrolla el beb puede ayudarla a disfrutar de la experiencia, y, adems, a que se d cuenta de cundo puede haber un problema y cundo hacer preguntas. CUNTO DURA UN EMBARAZO NORMAL? Generalmente, el embarazo dura 280das, o unas 40semanas. Se divide tres trimestres:  Primer trimestre: desde la semana0 a [id number].  Segundo trimestre: desde la [id number] a la27.  Tercer trimestre: [place] [id number] a la40. El da que se considera que el beb est listo para Associate Professor (a trmino) es la fecha prevista de [place]. CMO SE DESARROLLA EL BEB MES A MES? Primer mes  El vulo fecundado se implanta dentro del tero.  Algunas clulas formarn la placenta, y otras formarn el feto.  Empiezan a [employer], las piernas, la mdula [place], los pulmones y Film/video editor.  Al final del primer mes, el corazn comienza a Engineering geologist. Segundo mes  Se forman los [place], el odo interno, los prpados, las manos y [employer]on.  Se desarrollan los genitales.  Al final de las 8semanas, todos los rganos importantes estn en desarrollo. Tercer mes  Se estn formando todos los rganos internos.  Se forman los dientes debajo de las encas.  Empiezan a [employer] y los msculos. La columna vertebral tiene movimiento  de flexin.  La piel es transparente.  Empiezan a formarse las uas de las manos y [employer]ogg.  Los brazos y las piernas siguen [place], y se Buyer, retail las manos y los pies.  El feto mide aproximadamente 3pulgadas (7,6cm) de largo. Cuarto mes  La placenta est totalmente formada.  Se han formado los rganos sexuales externos, el cuello, las [place], las cejas, los prpados y las uas [place].  El feto puede or, tragar y Un[employer]ys Corporation brazos y las piernas.  Los riones Land[employer] Financial a Warden/ranger.  La piel est recubierta por una sustancia sebcea blanca (unto sebceo) y un vello muy fino (lanugo). Quinto mes  El feto se mueve ms y es posible sentirlo por primera vez (da pataditas).  Empieza a dormir y Clinical cytogeneticist, y tal vez comience a chuparse el dedo.  Crecen las uas en las puntas de los dedos.  Funciona el rgano del sistema digestivo que produce bilis (vescula biliar) y [place] a Publishing copy los nutrientes.  Si el beb es nia, tiene  vulos en los ovarios. Si el beb es varn, los testculos empiezan a Environmental education officer. Sexto mes  Se han formado los pulmones, pero el feto an no Electrical engineer.  Los ojos se abren. El cerebro sigue desarrollndose.  El beb tiene [employer] dedos de las manos y los pies. El cabello del beb se vuelve ms abundante.  A fines del segundo trimestre, el feto mide aproximadamente 9pulgadas (22,9cm) de largo. Sptimo mes  El feto patea y se estira.  Los ojos se han desarrollado lo suficiente como para percibir los cambios de [place].  Las manos pueden hacer movimientos de prensin.  El feto responde a los ruidos. Octavo mes  [employer] Omaha rganos, as como los sistemas y aparatos del organismo, estn totalmente desarrollados y en funcionamiento.  Los Aff[employer] Computer Services se solidifican, y se Therapist, sports botones gustativos. Es posible que el feto tenga hipo.  Determinadas regiones del cerebro an se estn desarrollando. El crneo  sigue siendo blando. Noveno mes  El feto aumenta aproximadamente libra (230g) cada semana.  Los pulmones estn totalmente desarrollados.  Se desarrollan los hbitos de sueo.  Generalmente, el feto se acomoda con la cabeza hacia abajo (presentacin ceflica de vrtice) en el tero para prepararse para el parto. En cambio, si los glteos se acomodan en esta posicin, el beb est de nalgas.  El feto pesa entre 6 y 9libras (2,72 y 63,08kg) y Hessie Knows 87 y 20pulgadas [phone] a 50,8cm) de Production designer, theatre/television/film. QU PUEDO HACER PARA QUE EL EMBARAZO SEA SANO Y PARA AYUDAR AL BEB A DESARROLLARSE? Comida y bebida  Consuma una dieta saludable.  Hable con el mdico para asegurarse de que est recibiendo los nutrientes que usted y el beb necesitan.  Visite www.BuildDNA.es para obtener ms informacin sobre cmo crear una dieta saludable.  El Sport and exercise psychologist cul es la cantidad saludable de peso a aumentar durante el [place], por lo general, Lyndal Pulley 25 y 35libras (21 y 16kg). Puede ser necesario que:  Aumente ms si tena bajo peso antes de quedar embarazada o si est embarazada de ms de un beb.  Aumente menos si tena sobrepeso u obesidad cuando qued [place]. Medicamentos y [place] vitaminas prenatales como se lo haya indicado el mdico, entre ellas, cido flico, hierro, calcio y vitaminaD, que son importantes para el desarrollo saludable.  Tome los medicamentos solamente como se lo haya indicado el mdico. Lea las etiquetas y consulte al farmacutico o al mdico si puede tomar medicamentos de [place], suplementos y medicamentos recetados durante el Media planner. Actividades  Haga actividad fsica como se lo haya aconsejado el mdico. Pdale al mdico que le recomiende actividades que sean seguras para usted, como caminar o Designer, television/film set.  No participe en deportes extremos ni extenuantes. Estilo de vida  No beba alcohol.  No consuma ningn producto que  contenga tabaco, lo que incluye cigarrillos, tabaco de Higher education careers adviser o Psychologist, sport and exercise. Si necesita ayuda para dejar de fumar, consulte al mdico.  No consuma drogas. Seguridad  No se exponga al mercurio, al plomo ni a otros metales pesados. Pregntele al mdico acerca de las fuentes comunes de estos metales pesados.  Evite la infeccin por listeria durante el embarazo. Tome las siguientes precauciones:  No coma quesos blandos ni fiambres.  No coma perros calientes, salvo que hayan sido calentados al punto de emitir vapor, por ejemplo, en el microondas.  No tome leche no pasteurizada.  Evite la infeccin por toxoplasmosis durante el embarazo. Tome las siguientes precauciones:  No cambie la arena sanitaria del gato, si tiene [place]. Pdale a otra persona que lo haga por usted.  Use guantes de jardinera mientras trabaja en el jardn. Instrucciones generales  Concurra a todas las visitas de control como se lo haya indicado el mdico. Esto es importante. Estas incluyen las visitas de cuidado prenatal y las pruebas de Programme researcher, broadcasting/film/video.  [place] control. Trabaje en estrecha colaboracin con el mdico para Automatic Data bajo control, por ejemplo, la diabetes. CMO S SI EL BEB SE EST DESARROLLANDO BIEN? En cada visita de cuidado prenatal, el mdico har varios estudios diferentes para controlar su estado de salud y Field seismologist un seguimiento del desarrollo del beb. Estos incluyen los siguientes:  Altura uterina.  El mdico le medir el vientre en crecimiento desde la parte superior a la inferior con una cinta [place].  Adems, le palpar el vientre para determinar la posicin del beb.  Latido cardaco.  Ardelia Mems ecografa realizada en el primer trimestre puede confirmar el embarazo y mostrar un latido cardaco, dependiendo del tiempo de Armed forces logistics/support/administrative officer.  El mdico controlar la frecuencia cardaca del beb en cada visita de cuidado prenatal.  A medida que se  aproxima la fecha de parto, tal vez se hagan controles habituales de la frecuencia cardaca para garantizar que no haya sufrimiento fetal.  Ecografa del segundo trimestre.  Esta ecografa controla el desarrollo del beb y tambin indica su sexo. QU DEBO HACER SI TENGO ALGUNA INQUIETUD RESPECTO DEL DESARROLLO DEL BEB? Hable siempre con el mdico si tiene alguna inquietud. Esta informacin no tiene Marine scientist el consejo del mdico. Asegrese de hacerle al mdico cualquier pregunta que tenga. Document Released: 02/09/2008 Document Revised: 12/15/2015 Document Reviewed: 01/30/2014 Elsevier Interactive Patient Education  2017 [employer].

## 2016-11-02 NOTE — Progress Notes (Signed)
  Subjective:    [name] [name] [name] is a [id number] [redacted]w[redacted]d being seen today for her first obstetrical visit.  Her obstetrical history is significant for advanced maternal age.  Pregnancy history fully reviewed.  Patient reports no complaints.  Vitals:   11/02/16 1416  BP: 110/80  Pulse: 80  Weight: 162 lb (73.5 kg)    HISTORY: OB History  Gravida Para Term Preterm AB Living  3 2 2     2   SAB TAB Ectopic Multiple Live Births          2    # Outcome Date GA Lbr Len/2nd Weight Sex Delivery Anes PTL Lv  3 Current           2 Term 05/21/10 [redacted]w[redacted]d  7 lb 3 oz (3.26 kg) F Vag-Spont EPI N LIV  1 Term 04/02/06 [redacted]w[redacted]d  7 lb 3 oz (3.26 kg) F Vag-Spont None N LIV     Past Medical History:  Diagnosis Date  . Gallbladder disease   . Symptomatic cholelithiasis 01/30/2013   Past Surgical History:  Procedure Laterality Date  . CHOLECYSTECTOMY N/A 04/04/2013   Procedure: LAPAROSCOPIC CHOLECYSTECTOMY POSSIBLE IOC;  Surgeon: Harl Bowie, MD;  Location: [facility];  Service: General;  Laterality: N/A;   Family History  Problem Relation Age of Onset  . Hypertension Mother   . Diabetes Mother   . Cancer Father     pancreatic  . Hypertension Maternal Grandmother      Exam                                      System:     Skin: normal coloration and turgor, no rashes    Neurologic: oriented, normal, normal mood   Extremities: normal strength, tone, and muscle mass   HEENT PERRLA   Mouth/Teeth mucous membranes moist, normal dentition   Neck supple and no masses   Cardiovascular: regular rate and rhythm   Respiratory:  appears well, vitals normal, no respiratory distress, acyanotic   Abdomen: soft, non-tender;  FHR: 150          Assessment:    Pregnancy: [id number] Patient Active Problem List   Diagnosis Date Noted  . Encounter for supervision of other normal pregnancy 11/02/2016        Plan:     Initial labs drawn. Continue prenatal vitamins   Problem list reviewed and updated  Reviewed n/v relief measures and warning s/s to report  Reviewed recommended weight gain based on pre-gravid BMI  Encouraged well-balanced diet Genetic Screening discussed Quad Screen: requested.  Ultrasound discussed; fetal survey: requested.  Return in about 3 weeks (around 11/23/2016) for LROB, [id number].  [name] [name],[name] [name] [name] [name] 11/02/2016

## 2016-11-03 LAB — URINALYSIS, ROUTINE W REFLEX MICROSCOPIC
Bilirubin, UA: NEGATIVE
GLUCOSE, UA: NEGATIVE
KETONES UA: NEGATIVE
LEUKOCYTES UA: NEGATIVE
Nitrite, UA: NEGATIVE
Protein, UA: NEGATIVE
RBC, UA: NEGATIVE
SPEC GRAV UA: 1.008 (ref 1.005–1.030)
Urobilinogen, Ur: 0.2 mg/dL (ref 0.2–1.0)
pH, UA: 6.5 (ref 5.0–7.5)

## 2016-11-03 LAB — PMP SCREEN PROFILE (10S), URINE
AMPHETAMINE SCRN UR: NEGATIVE ng/mL
Barbiturate Screen, Ur: NEGATIVE ng/mL
Benzodiazepine Screen, Urine: NEGATIVE ng/mL
CANNABINOIDS UR QL SCN: NEGATIVE ng/mL
Cocaine(Metab.)Screen, Urine: NEGATIVE ng/mL
Creatinine(Crt), U: 35.2 mg/dL (ref 20.0–300.0)
Methadone Scn, Ur: NEGATIVE ng/mL
Opiate Scrn, Ur: NEGATIVE ng/mL
Oxycodone+Oxymorphone Ur Ql Scn: NEGATIVE ng/mL
PCP SCRN UR: NEGATIVE ng/mL
PH UR, DRUG SCRN: 6.5 (ref 4.5–8.9)
Propoxyphene, Screen: NEGATIVE ng/mL

## 2016-11-03 LAB — HIV ANTIBODY (ROUTINE TESTING W REFLEX): HIV SCREEN 4TH GENERATION: NONREACTIVE

## 2016-11-03 LAB — GC/CHLAMYDIA PROBE AMP
Chlamydia trachomatis, NAA: NEGATIVE
NEISSERIA GONORRHOEAE BY PCR: NEGATIVE

## 2016-11-03 LAB — VARICELLA ZOSTER ANTIBODY, IGG: VARICELLA: 1182 {index} (ref >165)

## 2016-11-03 LAB — CBC
HEMATOCRIT: 38.6 % (ref 34.0–46.6)
HEMOGLOBIN: 13.1 g/dL (ref 11.1–15.9)
MCH: 29.8 pg (ref 26.6–33.0)
MCHC: 33.9 g/dL (ref 31.5–35.7)
MCV: 88 fL (ref 79–97)
Platelets: 231 10*3/uL (ref 150–379)
RBC: 4.39 x10E6/uL (ref 3.77–5.28)
RDW: 13.1 % (ref 12.3–15.4)
WBC: 9 10*3/uL (ref 3.4–10.8)

## 2016-11-03 LAB — RUBELLA SCREEN: Rubella Antibodies, IGG: 23.8 index (ref >0.99)

## 2016-11-03 LAB — HEPATITIS B SURFACE ANTIGEN: HEP B S AG: NEGATIVE

## 2016-11-03 LAB — RPR: RPR Ser Ql: NONREACTIVE

## 2016-11-03 LAB — ABO/RH: Rh Factor: POSITIVE

## 2016-11-03 LAB — ANTIBODY SCREEN: ANTIBODY SCREEN: NEGATIVE

## 2016-11-04 LAB — URINE CULTURE

## 2016-11-22 ENCOUNTER — Encounter: Payer: Self-pay | Admitting: Women's Health

## 2016-11-22 ENCOUNTER — Ambulatory Visit (INDEPENDENT_AMBULATORY_CARE_PROVIDER_SITE_OTHER): Payer: Medicaid Other

## 2016-11-22 ENCOUNTER — Ambulatory Visit (INDEPENDENT_AMBULATORY_CARE_PROVIDER_SITE_OTHER): Payer: Medicaid Other | Admitting: Women's Health

## 2016-11-22 ENCOUNTER — Encounter (INDEPENDENT_AMBULATORY_CARE_PROVIDER_SITE_OTHER): Payer: Self-pay

## 2016-11-22 VITALS — BP 120/80 | HR 78 | Wt 165.4 lb

## 2016-11-22 DIAGNOSIS — Z3482 Encounter for supervision of other normal pregnancy, second trimester: Secondary | ICD-10-CM

## 2016-11-22 DIAGNOSIS — Z1389 Encounter for screening for other disorder: Secondary | ICD-10-CM

## 2016-11-22 DIAGNOSIS — Z331 Pregnant state, incidental: Secondary | ICD-10-CM

## 2016-11-22 DIAGNOSIS — Z363 Encounter for antenatal screening for malformations: Secondary | ICD-10-CM

## 2016-11-22 DIAGNOSIS — Z3A19 19 weeks gestation of pregnancy: Secondary | ICD-10-CM

## 2016-11-22 NOTE — Patient Instructions (Signed)

## 2016-11-22 NOTE — Progress Notes (Signed)
Low-risk OB appointment [id number] [redacted]w[redacted]d Estimated Date of Delivery: 04/17/17 BP 120/80   Pulse 78   Wt 165 lb 6.4 oz (75 kg)   LMP 07/11/2016 (Exact Date)   BMI 28.39 kg/m   BP, weight, and urine reviewed.  Refer to obstetrical flow sheet for FH & FHR.  Reports good fm.  Denies regular uc's, lof, vb, or uti s/s. No complaints. Reviewed today's normal anatomy u/s. Plan:  Continue routine obstetrical care  F/U in 4wks for OB appointment  AFP today

## 2016-11-22 NOTE — Progress Notes (Signed)
[place] 19+1 wks,breech,post pl gr 0,normal ov's bilat,cx 4.6 cm,svp of fluid 4.8 cm,fhr 152 bpm,efw 292 g,anatomy complete,no obvious abnormalities seen

## 2016-11-26 LAB — AFP, QUAD SCREEN
DIA Mom Value: 0.83
DIA VALUE (EIA): 146.21 pg/mL
DSR (By Age)    1 IN: 239
DSR (SECOND TRIMESTER) 1 IN: 2816
GESTATIONAL AGE AFP: 19.1 wk
MSAFP Mom: 1.05
MSAFP: 49 ng/mL
MSHCG Mom: 1.84
MSHCG: 43181 m[IU]/mL
Maternal Age At EDD: 36 YEARS
OSB RISK: 10000
T18 (By Age): 1:930 {titer}
Test Results:: NEGATIVE
UE3 MOM: 1
WEIGHT: 165 [lb_av]
uE3 Value: 1.59 ng/mL

## 2016-12-20 ENCOUNTER — Ambulatory Visit (INDEPENDENT_AMBULATORY_CARE_PROVIDER_SITE_OTHER): Payer: Medicaid Other | Admitting: Obstetrics & Gynecology

## 2016-12-20 ENCOUNTER — Encounter: Payer: Self-pay | Admitting: Obstetrics & Gynecology

## 2016-12-20 VITALS — BP 138/58 | HR 78 | Wt 175.0 lb

## 2016-12-20 DIAGNOSIS — Z331 Pregnant state, incidental: Secondary | ICD-10-CM | POA: Diagnosis not present

## 2016-12-20 DIAGNOSIS — Z1389 Encounter for screening for other disorder: Secondary | ICD-10-CM | POA: Diagnosis not present

## 2016-12-20 DIAGNOSIS — Z3A23 23 weeks gestation of pregnancy: Secondary | ICD-10-CM

## 2016-12-20 DIAGNOSIS — Z3482 Encounter for supervision of other normal pregnancy, second trimester: Secondary | ICD-10-CM

## 2016-12-20 LAB — POCT URINALYSIS DIPSTICK
Glucose, UA: NEGATIVE
KETONES UA: NEGATIVE
LEUKOCYTES UA: NEGATIVE
NITRITE UA: NEGATIVE
Protein, UA: NEGATIVE
Protein, UA: NEGATIVE

## 2016-12-20 NOTE — Progress Notes (Signed)
[id number] [redacted]w[redacted]d Estimated Date of Delivery: 04/17/17  Blood pressure (!) 138/58, pulse 78, weight 175 lb (79.4 kg), last menstrual period 07/11/2016.   BP weight and urine results all reviewed and noted.  Please refer to the obstetrical flow sheet for the fundal height and fetal heart rate documentation:  Patient reports good fetal movement, denies any bleeding and no rupture of membranes symptoms or regular contractions. Patient is without complaints. All questions were answered.  Orders Placed This Encounter  Procedures  . POCT Urinalysis Dipstick    Plan:  Continued routine obstetrical care, PN2 next visit  Return in about 4 weeks (around 01/17/2017) for PN2, , LROB.

## 2017-01-18 ENCOUNTER — Ambulatory Visit (INDEPENDENT_AMBULATORY_CARE_PROVIDER_SITE_OTHER): Payer: Medicaid Other | Admitting: Women's Health

## 2017-01-18 ENCOUNTER — Other Ambulatory Visit: Payer: Medicaid Other

## 2017-01-18 ENCOUNTER — Encounter: Payer: Self-pay | Admitting: Women's Health

## 2017-01-18 VITALS — BP 110/74 | HR 72 | Wt 176.0 lb

## 2017-01-18 DIAGNOSIS — Z3482 Encounter for supervision of other normal pregnancy, second trimester: Secondary | ICD-10-CM

## 2017-01-18 DIAGNOSIS — Z331 Pregnant state, incidental: Secondary | ICD-10-CM | POA: Diagnosis not present

## 2017-01-18 DIAGNOSIS — Z3A27 27 weeks gestation of pregnancy: Secondary | ICD-10-CM

## 2017-01-18 DIAGNOSIS — Z1389 Encounter for screening for other disorder: Secondary | ICD-10-CM | POA: Diagnosis not present

## 2017-01-18 DIAGNOSIS — Z131 Encounter for screening for diabetes mellitus: Secondary | ICD-10-CM

## 2017-01-18 LAB — POCT URINALYSIS DIPSTICK
Blood, UA: NEGATIVE
Glucose, UA: NEGATIVE
KETONES UA: NEGATIVE
LEUKOCYTES UA: NEGATIVE
Nitrite, UA: NEGATIVE

## 2017-01-18 NOTE — Patient Instructions (Signed)
Call the office [phone]) or go to [facility] if:  You begin to have strong, frequent contractions  Your water breaks.  Sometimes it is a big gush of fluid, sometimes it is just a trickle that keeps getting your panties wet or running down your legs  You have vaginal bleeding.  It is normal to have a small amount of spotting if your cervix was checked.   You don't feel your baby moving like normal.  If you don't, get you something to eat and drink and lay down and focus on feeling your baby move.  You should feel at least 10 movements in 2 hours.  If you don't, you should call the office or go to [facility].     Tdap Vaccine  It is recommended that you get the Tdap vaccine during the third trimester of EACH pregnancy to help protect your baby from getting pertussis (whooping cough)  27-36 weeks is the BEST time to do this so that you can pass the protection on to your baby. During pregnancy is better than after pregnancy, but if you are unable to get it during pregnancy it will be offered at the hospital.   You can get this vaccine at the health department or your family doctor  Everyone who will be around your baby should also be up-to-date on their vaccines. Adults (who are not pregnant) only need 1 dose of Tdap during adulthood.     Tercer trimestre de Media planner (Third Trimester of Pregnancy) El tercer trimestre comprende desde la [id number] hasta la [id number], es decir, desde el mes7 hasta el mes9. El tercer trimestre es un perodo en el que el feto crece rpidamente. Hacia el final del noveno mes, el feto mide alrededor de 20pulgadas (45cm) de largo y pesa entre 6 y 75 libras (2,700 y 22,500kg). CAMBIOS EN EL ORGANISMO Su organismo atraviesa por muchos cambios durante el [place], y estos varan de Ardelia Mems mujer a Theatre manager.  Seguir [employer]. Es de esperar que aumente entre 25 y 35libras (94 y 16kg) hacia el final del Media planner.  Podrn aparecer las primeras  [employer] caderas, el abdomen y las [place].  Puede tener necesidad de Garment/textile technologist con ms frecuencia porque el feto baja hacia la pelvis y ejerce presin sobre la vejiga.  Debido al Glennis Brink podr sentir Victorio Palm estomacal con frecuencia.  Puede estar estreida, ya que ciertas hormonas enlentecen los movimientos de los msculos que [employer] desechos a travs de los intestinos.  Pueden aparecer hemorroides o abultarse e hincharse las venas (venas varicosas).  Puede sentir dolor plvico debido al [employer] y a que las hormonas del Scientist, research (life sciences) las articulaciones entre los huesos de la pelvis. El dolor de espalda puede ser consecuencia de la sobrecarga de los msculos que soportan la [place].  Tal vez haya cambios en el cabello que pueden incluir su engrosamiento, crecimiento rpido y cambios en la textura. Adems, a algunas mujeres se les cae el cabello durante o despus del embarazo, o tienen el cabello seco o fino. Lo ms probable es que el cabello se le normalice despus del nacimiento del beb.  Las [employer] seguirn creciendo y Teaching laboratory technician. A veces, puede haber una secrecin amarilla de las mamas llamada calostro.  El ombligo puede salir hacia afuera.  Puede sentir que le falta el aire debido a que se expande el tero.  Puede notar que el feto "baja" o lo siente ms bajo, en el abdomen.  Puede tener una prdida  de secrecin mucosa con sangre. Esto suele ocurrir en el trmino de unos pocos das a una semana antes de que comience el [place].  El cuello del tero se vuelve delgado y blando (se borra) cerca de la fecha de [place]. QU DEBE ESPERAR EN LOS EXMENES PRENATALES Le harn exmenes prenatales cada 2semanas hasta la semana36. A partir de ese momento le harn exmenes semanales. Durante una visita prenatal de rutina:  La pesarn para asegurarse de que usted y el feto estn creciendo normalmente.  Le tomarn la presin arterial.  Le medirn el abdomen para  controlar el desarrollo del beb.  Se escucharn los latidos cardacos fetales.  Se evaluarn los resultados de los estudios solicitados en visitas anteriores.  Le revisarn el cuello del tero cuando est prxima la fecha de parto para controlar si este se ha borrado. Alrededor de la semana36, el mdico le revisar el cuello del tero. Al mismo tiempo, realizar un anlisis de las secreciones del tejido vaginal. Este examen es para determinar si hay un tipo de bacteria, estreptococo Grupo B. El mdico le explicar esto con ms detalle. El mdico puede preguntarle lo siguiente:  Cmo le gustara que fuera el [place].  Cmo se siente.  Si siente los movimientos del beb.  Si ha tenido sntomas anormales, como prdida de lquido, [place], dolores de cabeza intensos o clicos abdominales.  Si est consumiendo algn producto que contenga tabaco, como cigarrillos, tabaco de Higher education careers adviser y Psychologist, sport and exercise.  Si tiene [employer]. Otros exmenes o estudios de deteccin que pueden realizarse durante el tercer trimestre incluyen lo siguiente:  Anlisis de sangre para controlar los niveles de hierro (anemia).  Controles fetales para determinar su salud, nivel de [place] y Mining engineer. Si tiene [place] enfermedad o hay problemas durante el embarazo, le harn estudios.  Prueba del VIH (virus de inmunodeficiencia humana). Si corre Electronics engineer, pueden realizarle una prueba de deteccin del VIH durante el tercer trimestre del embarazo. FALSO TRABAJO DE PARTO Es posible que sienta contracciones leves e irregulares que finalmente desaparecen. Se llaman contracciones de Braxton Hicks o falso trabajo de [place]. Las Yahoo pueden durar horas, das o incluso semanas, antes de que el verdadero trabajo de parto se inicie. Si las contracciones ocurren a intervalos regulares, se intensifican o se hacen dolorosas, lo mejor es que la revise el mdico. SIGNOS DE TRABAJO DE PARTO  Clicos de tipo  menstrual.  Contracciones cada 72minutos o menos.  Contracciones que comienzan en la parte superior del tero y se extienden hacia abajo, a la zona inferior del abdomen y la espalda.  Sensacin de mayor presin en la pelvis o dolor de espalda.  Una secrecin de mucosidad acuosa o con sangre que sale de la vagina. Si tiene alguno de estos signos antes de la [id number] del Media planner, llame a su mdico de inmediato. Debe concurrir al hospital para que la controlen inmediatamente. INSTRUCCIONES PARA EL CUIDADO EN EL HOGAR  Evite fumar, consumir hierbas, beber alcohol y tomar frmacos que no le hayan recetado. Estas sustancias qumicas afectan la formacin y el desarrollo del beb.  No consuma ningn producto que contenga tabaco, lo que incluye cigarrillos, tabaco de Higher education careers adviser y Psychologist, sport and exercise. Si necesita ayuda para dejar de fumar, consulte al [employer]. Puede recibir asesoramiento y otro tipo de recursos para dejar de fumar.  [place] mdico en relacin con el uso de medicamentos. Durante el embarazo, hay medicamentos que son seguros de tomar y otros que no.  Haga ejercicio  solamente como se lo haya indicado el mdico. Sentir clicos uterinos es un buen signo para Ambulance person actividad fsica.  Contine comiendo alimentos sanos con regularidad.  Use un sostn que le brinde buen soporte si le [employer].  No se d baos de inmersin en agua caliente, baos turcos ni saunas.  Use el cinturn de seguridad en todo momento mientras conduce.  No coma carne cruda ni queso sin cocinar; evite el contacto con las bandejas sanitarias de los gatos y la tierra que estos animales usan. Estos elementos contienen grmenes que pueden causar defectos congnitos en el beb.  [place].  Tome entre 1500 y 2000mg  de calcio diariamente comenzando en la [id number] del embarazo [place].  Si est estreida, pruebe un laxante suave (si el mdico lo autoriza).  Consuma ms alimentos ricos en fibra, como vegetales y frutas frescos y Psychologist, prison and probation services. Beba gran cantidad de lquido para mantener la orina de tono claro o color amarillo plido.  Dese baos de asiento con agua tibia para Best boy o las molestias causadas por las hemorroides. Use una crema para las hemorroides si el mdico la autoriza.  Si tiene venas varicosas, use medias de descanso. Eleve los pies durante 27minutos, 3 o 4veces por da. Limite el consumo de sal en su dieta.  Evite levantar objetos pesados, use zapatos de tacones bajos y [place].  Descanse con las piernas elevadas si tiene calambres o dolor de cintura.  Visite a su dentista si no lo ha Quarry manager. Use un cepillo de dientes blando para higienizarse los dientes y psese el hilo dental con suavidad.  Puede seguir [employer], a menos que el mdico le indique lo contrario.  No haga viajes largos excepto que sea absolutamente necesario y solo con la autorizacin del [place] clases prenatales para Development worker, international aid, Psychologist, prison and probation services y hacer preguntas sobre el [place] de parto y [place].  Haga un ensayo de la partida al hospital.  Prepare el bolso que llevar al hospital.  Prepare la habitacin del beb.  Concurra a todas las visitas prenatales segn las indicaciones de su mdico. SOLICITE ATENCIN MDICA SI:  No est segura de que est en trabajo de parto o de que ha roto la bolsa de las aguas.  Tiene mareos.  Siente clicos leves, presin en la pelvis o dolor persistente en el abdomen.  Tiene nuseas, vmitos o diarrea persistentes.  Margette Fast secrecin vaginal con mal olor.  Siente dolor al [employer]. SOLICITE ATENCIN MDICA DE INMEDIATO SI:  Tiene fiebre.  Tiene una prdida de lquido por la vagina.  Tiene sangrado o pequeas prdidas vaginales.  Siente dolor intenso o clicos en el abdomen.  Sube o baja de peso rpidamente.  Tiene dificultad  para respirar y siente dolor de pecho.  Sbitamente se le hinchan mucho el rostro, las [place], los tobillos, los pies o las piernas.  No ha sentido los movimientos del beb durante Leone Brand.  Siente un dolor de cabeza intenso que no se alivia con medicamentos.  Su visin se modifica. Esta informacin no tiene Marine scientist el consejo del mdico. Asegrese de hacerle al mdico cualquier pregunta que tenga. Document Released: 06/02/2005 Document Revised: 09/13/2014 Document Reviewed: 10/24/2012 Elsevier Interactive Patient Education  2017 [employer].

## 2017-01-18 NOTE — Progress Notes (Signed)
Low-risk OB appointment [id number] [redacted]w[redacted]d Estimated Date of Delivery: 04/17/17 BP 110/74   Pulse 72   Wt 176 lb (79.8 kg)   LMP 07/11/2016 (Exact Date)   BMI 30.21 kg/m   BP, weight, and urine reviewed.  Refer to obstetrical flow sheet for FH & FHR.  Reports good fm.  Denies regular uc's, lof, vb, or uti s/s. No complaints. Wants in-hospital BTL, discussed risks/benefits, consent signed today. Professional interpreter present and used for communication of all information.  Reviewed ptl s/s, fkc. Recommended Tdap at HD/PCP per CDC guidelines.  Plan:  Continue routine obstetrical care  F/U in 3wks for OB appointment  PN2 today

## 2017-01-19 LAB — CBC
Hematocrit: 36.5 % (ref 34.0–46.6)
Hemoglobin: 11.7 g/dL (ref 11.1–15.9)
MCH: 28.9 pg (ref 26.6–33.0)
MCHC: 32.1 g/dL (ref 31.5–35.7)
MCV: 90 fL (ref 79–97)
PLATELETS: 224 10*3/uL (ref 150–379)
RBC: 4.05 x10E6/uL (ref 3.77–5.28)
RDW: 13 % (ref 12.3–15.4)
WBC: 7.3 10*3/uL (ref 3.4–10.8)

## 2017-01-19 LAB — GLUCOSE TOLERANCE, 2 HOURS W/ 1HR
GLUCOSE, FASTING: 99 mg/dL — AB (ref 65–91)
Glucose, 1 hour: 203 mg/dL — ABNORMAL HIGH (ref 65–179)
Glucose, 2 hour: 157 mg/dL — ABNORMAL HIGH (ref 65–152)

## 2017-01-19 LAB — ANTIBODY SCREEN: Antibody Screen: NEGATIVE

## 2017-01-19 LAB — RPR: RPR Ser Ql: NONREACTIVE

## 2017-01-19 LAB — HIV ANTIBODY (ROUTINE TESTING W REFLEX): HIV SCREEN 4TH GENERATION: NONREACTIVE

## 2017-02-04 ENCOUNTER — Other Ambulatory Visit: Payer: Self-pay | Admitting: *Deleted

## 2017-02-04 ENCOUNTER — Telehealth: Payer: Self-pay | Admitting: *Deleted

## 2017-02-04 DIAGNOSIS — O24410 Gestational diabetes mellitus in pregnancy, diet controlled: Secondary | ICD-10-CM

## 2017-02-04 NOTE — Telephone Encounter (Signed)
[name] Wu call and informed of diagnosis of GDM, referral sent to dietician and should get call in the next week. If she does not, to give [place] a call back so we can follow-up.

## 2017-02-08 ENCOUNTER — Encounter: Payer: Self-pay | Admitting: Nutrition

## 2017-02-08 ENCOUNTER — Encounter: Payer: Medicaid Other | Attending: Advanced Practice Midwife | Admitting: Nutrition

## 2017-02-08 ENCOUNTER — Encounter: Payer: Self-pay | Admitting: Obstetrics & Gynecology

## 2017-02-08 ENCOUNTER — Ambulatory Visit (INDEPENDENT_AMBULATORY_CARE_PROVIDER_SITE_OTHER): Payer: Medicaid Other | Admitting: Obstetrics & Gynecology

## 2017-02-08 VITALS — BP 116/54 | HR 72 | Wt 177.0 lb

## 2017-02-08 VITALS — Ht 65.0 in | Wt 177.8 lb

## 2017-02-08 DIAGNOSIS — Z331 Pregnant state, incidental: Secondary | ICD-10-CM | POA: Diagnosis not present

## 2017-02-08 DIAGNOSIS — O0993 Supervision of high risk pregnancy, unspecified, third trimester: Secondary | ICD-10-CM

## 2017-02-08 DIAGNOSIS — Z1389 Encounter for screening for other disorder: Secondary | ICD-10-CM | POA: Diagnosis not present

## 2017-02-08 DIAGNOSIS — Z713 Dietary counseling and surveillance: Secondary | ICD-10-CM | POA: Diagnosis not present

## 2017-02-08 DIAGNOSIS — Z3A00 Weeks of gestation of pregnancy not specified: Secondary | ICD-10-CM | POA: Insufficient documentation

## 2017-02-08 DIAGNOSIS — O24410 Gestational diabetes mellitus in pregnancy, diet controlled: Secondary | ICD-10-CM | POA: Diagnosis not present

## 2017-02-08 LAB — POCT URINALYSIS DIPSTICK
GLUCOSE UA: NEGATIVE
Leukocytes, UA: NEGATIVE
Nitrite, UA: NEGATIVE
PROTEIN UA: NEGATIVE
RBC UA: NEGATIVE

## 2017-02-08 NOTE — Progress Notes (Signed)
Diabetes Self-Management Education  Visit Type: First/Initial  Appt. Start Time: 1330 Appt. End Time: 1500  02/08/2017  Ms. [name] Wu, identified by name and date of birth, is a 35 y.o. female with a diagnosis of Diabetes: Gestational Diabetes. Here with interpreter. G 3 P 2.  EDU 04-17-17  Interpeter Zenda Alpers.   ASSESSMENT  Height 5\' 5"  (1.651 m), weight 177 lb 12.8 oz (80.6 kg), last menstrual period 07/11/2016. Body mass index is 29.59 kg/m.  Blood Glucose Monitoring Instruction  Assessment:  Primary concerns today: Patient here for instruction on Blood Glucose Monitoring. They do not have their own meter at this time. BS was 90 mg/dl 3 hrs after lunch.  Meter Provided: Yes  If Yes, Brand:  Accucheck Guid Lot #:  [id number] Exp 10/22/17   Medications: Prenatal Vits.     Intervention:    Explained rationale of testing BG to obtain data as to how their diabetes is being managed.  Provided Target Ranges for both pre and post meals  Explained factors that effect BG including food (carbohydrate), stress, activity level and insulin availability in the body including diabetes medications  Taught patient techniques for using BG monitor and lancing device  Discussed need for Rx for strips and lancets   Explained rationale of recording BG both for patient and MD to assess patterns as needed.  Follow Up: Patient offered follow up as needed.      Diabetes Self-Management Education - 02/08/17 1330      Visit Information   Visit Type First/Initial     Initial Visit   Diabetes Type Gestational Diabetes   Are you currently following a meal plan? No   Are you taking your medications as prescribed? Not on Medications   Date Diagnosed May 2018     Health Coping   How would you rate your overall health? Good     Psychosocial Assessment   Patient Belief/Attitude about Diabetes Motivated to manage diabetes   Self-care barriers None   Other persons present  Patient;Interpreter   Patient Concerns Nutrition/Meal planning;Monitoring;Healthy Lifestyle;Glycemic Control   Special Needs None   Preferred Learning Style No preference indicated   Learning Readiness Not Ready   How often do you need to have someone help you when you read instructions, pamphlets, or other written materials from your doctor or pharmacy? 1 - Never   What is the last grade level you completed in school? 11     Pre-Education Assessment   Patient understands the diabetes disease and treatment process. Needs Instruction   Patient understands incorporating nutritional management into lifestyle. Needs Instruction   Patient undertands incorporating physical activity into lifestyle. Needs Instruction   Patient understands using medications safely. Needs Instruction   Patient understands monitoring blood glucose, interpreting and using results Needs Instruction   Patient understands prevention, detection, and treatment of acute complications. Needs Instruction   Patient understands prevention, detection, and treatment of chronic complications. Needs Instruction   Patient understands how to develop strategies to address psychosocial issues. Needs Instruction   Patient understands how to develop strategies to promote health/change behavior. Needs Instruction     Complications   Last HgB A1C per patient/outside source 5.6 %   How often do you check your blood sugar? 0 times/day (not testing)   Have you had a dilated eye exam in the past 12 months? No   Have you had a dental exam in the past 12 months? No   Are you checking your feet? No  Dietary Intake   Breakfast eggs, 1 slice toast, fruit   Lunch meat, vegetables, fruit   Snack (afternoon) fruit or msc   Dinner chicken, vegetables, beans, rice, water   Beverage(s) water     Exercise   Exercise Type ADL's     Patient Education   Nutrition management  Role of diet in the treatment of diabetes and the relationship between  the three main macronutrients and blood glucose level;Food label reading, portion sizes and measuring food.;Carbohydrate counting;Information on hints to eating out and maintain blood glucose control.;Meal timing in regards to the patients' current diabetes medication.;Reviewed blood glucose goals for pre and post meals and how to evaluate the patients' food intake on their blood glucose level.   Preconception care Reviewed with patient blood glucose goals with pregnancy;Pregnancy and GDM  Role of pre-pregnancy blood glucose control on the development of the fetus     Individualized Goals (developed by patient)   Nutrition Follow meal plan discussed;General guidelines for healthy choices and portions discussed;Adjust meds/carbs with exercise as discussed   Physical Activity Exercise 3-5 times per week;30 minutes per day   Medications Not Applicable   Monitoring  test my blood glucose as discussed   Reducing Risk examine blood glucose patterns;get labs drawn;do foot checks daily;treat hypoglycemia with 15 grams of carbs if blood glucose less than 70mg /dL;increase portions of olive oil in diet     Post-Education Assessment   Patient understands the diabetes disease and treatment process. Needs Review   Patient understands incorporating nutritional management into lifestyle. Needs Review   Patient undertands incorporating physical activity into lifestyle. Needs Review   Patient understands monitoring blood glucose, interpreting and using results Demonstrates understanding / competency   Patient understands prevention, detection, and treatment of acute complications. Demonstrates understanding / competency   Patient understands prevention, detection, and treatment of chronic complications. Demonstrates understanding / competency   Patient understands how to develop strategies to address psychosocial issues. Demonstrates understanding / competency   Patient understands how to develop strategies to  promote health/change behavior. Demonstrates understanding / competency     Outcomes   Expected Outcomes Demonstrated interest in learning. Expect positive outcomes   Future DMSE --  1 week   Program Status Completed      Individualized Plan for Diabetes Self-Management Training:   Learning Objective:  Patient will have a greater understanding of diabetes self-management. Patient education plan is to attend individual and/or group sessions per assessed needs and concerns.   Plan:   There are no Patient Instructions on file for this visit.  Expected Outcomes:  Demonstrated interest in learning. Expect positive outcomes  Education material provided: Meal plan card, My Plate and Carbohydrate counting sheet  If problems or questions, patient to contact team via:  Phone and Email  Future DSME appointment:  (1 week)

## 2017-02-08 NOTE — Progress Notes (Signed)
Fetal Surveillance Testing today:  FHR 145   High Risk Pregnancy Diagnosis(es):   Class A1 DM  G3P2002 [redacted]w[redacted]d Estimated Date of Delivery: 04/17/17  Blood pressure (!) 116/54, pulse 72, weight 177 lb (80.3 kg), last menstrual period 07/11/2016.  Urinalysis: Negative   HPI: The patient is being seen today for ongoing management of Class A1 DM. Today she reports went to dietitian today and had her counselling, will bring CBGs in next week   BP weight and urine results all reviewed and noted. Patient reports good fetal movement, denies any bleeding and no rupture of membranes symptoms or regular contractions.  Fundal Height:  33 Fetal Heart rate:  145 Edema:  none  Patient is without complaints other than noted in her HPI. All questions were answered.  All lab and sonogram results have been reviewed. Comments:    Assessment:  1.  Pregnancy at [redacted]w[redacted]d,  Estimated Date of Delivery: 04/17/17 :                          2.  Class A1 DM                       3.    Medication(s) Plans:  None for now  Treatment Plan:  Am suspicious may fail diet with her CBG fasting 99  Return in about 1 week (around 02/15/2017) for [place], with Dr Elonda Husky. for appointment for high risk OB care  No orders of the defined types were placed in this encounter.  Orders Placed This Encounter  Procedures  . POCT urinalysis dipstick

## 2017-02-08 NOTE — Patient Instructions (Signed)
Goals . Follow Gestational Meal Plan   Eat meals on time   Test blood sugar before breakfast and 2 hours after breakfast, lunch and dinner daily and record on sheets.  Bring meter and BS log to appointments.   Drink water   Eat 2-3 carb choices per meal and 15-30 grams of carbs for snack.   Goals: Am 60-90 mg/dl              2 hours after meals  Less than 120 mg/dl. Exercise 30 minutes a day.

## 2017-02-10 ENCOUNTER — Telehealth: Payer: Self-pay | Admitting: *Deleted

## 2017-02-10 NOTE — Telephone Encounter (Signed)
Lancets and strips called to [facility]

## 2017-02-10 NOTE — Telephone Encounter (Signed)
Informed patient strips and lancets were called to pharmacy.

## 2017-02-14 ENCOUNTER — Ambulatory Visit (INDEPENDENT_AMBULATORY_CARE_PROVIDER_SITE_OTHER): Payer: Medicaid Other | Admitting: Obstetrics & Gynecology

## 2017-02-14 ENCOUNTER — Encounter: Payer: Self-pay | Admitting: Obstetrics & Gynecology

## 2017-02-14 VITALS — BP 120/74 | HR 87 | Wt 174.0 lb

## 2017-02-14 DIAGNOSIS — Z1389 Encounter for screening for other disorder: Secondary | ICD-10-CM

## 2017-02-14 DIAGNOSIS — O24419 Gestational diabetes mellitus in pregnancy, unspecified control: Secondary | ICD-10-CM | POA: Diagnosis not present

## 2017-02-14 DIAGNOSIS — Z331 Pregnant state, incidental: Secondary | ICD-10-CM

## 2017-02-14 DIAGNOSIS — O0993 Supervision of high risk pregnancy, unspecified, third trimester: Secondary | ICD-10-CM | POA: Diagnosis not present

## 2017-02-14 LAB — POCT URINALYSIS DIPSTICK
Glucose, UA: NEGATIVE
Leukocytes, UA: NEGATIVE
Nitrite, UA: NEGATIVE
PROTEIN UA: NEGATIVE
RBC UA: NEGATIVE

## 2017-02-14 MED ORDER — GLYBURIDE 2.5 MG PO TABS: ORAL_TABLET | ORAL | 3 refills | Status: DC

## 2017-02-14 NOTE — Progress Notes (Signed)
Fetal Surveillance Testing today:  FHR 145   High Risk Pregnancy Diagnosis(es):   Class A2 DM, now  G3P2002 [redacted]w[redacted]d Estimated Date of Delivery: 04/17/17  Blood pressure 120/74, pulse 87, weight 174 lb (78.9 kg), last menstrual period 07/11/2016.  Urinalysis: Negative   HPI: The patient is being seen today for ongoing management of as above. Today she reports CBG are a bit high in AM fasting others are ok, will begin glyburide 2.5 mg 2200   BP weight and urine results all reviewed and noted. Patient reports good fetal movement, denies any bleeding and no rupture of membranes symptoms or regular contractions.  Fundal Height:  32 cm fettal Heart rate:  140 Edema:  None  Patient is without complaints other than noted in her HPI. All questions were answered.  All lab and sonogram results have been reviewed. Comments:    Assessment:  1.  Pregnancy at [redacted]w[redacted]d,  Estimated Date of Delivery: 04/17/17 :                          2.  Class A1, now A2 Diabetes                        3.    Medication(s) Plans:  Begin glyburide 2.5 mg at bedtime, 2200 due to elevated fastings >100  Treatment Plan:  Begin twice weekly NST at 32 weeks with EFW @ 32, 36 weeks  Return in about 1 week (around 02/21/2017) for sonogram,, HROB, with Dr Elonda Husky. for appointment for high risk OB care  Meds ordered this encounter  Medications  . glyBURIDE (DIABETA) 2.5 MG tablet    Sig: Take 1 tablet at bedtime(10PM)    Dispense:  30 tablet    Refill:  3   Orders Placed This Encounter  Procedures  . US OB Follow Up  . POCT urinalysis dipstick

## 2017-02-16 ENCOUNTER — Encounter: Payer: Medicaid Other | Attending: Obstetrics and Gynecology | Admitting: Nutrition

## 2017-02-16 VITALS — Wt 173.0 lb

## 2017-02-16 DIAGNOSIS — E119 Type 2 diabetes mellitus without complications: Secondary | ICD-10-CM | POA: Diagnosis present

## 2017-02-16 DIAGNOSIS — Z713 Dietary counseling and surveillance: Secondary | ICD-10-CM | POA: Insufficient documentation

## 2017-02-16 DIAGNOSIS — O24410 Gestational diabetes mellitus in pregnancy, diet controlled: Secondary | ICD-10-CM

## 2017-02-16 NOTE — Progress Notes (Signed)
Diabetes Self-Management Education  Visit Type:    Appt. Start Time: 1330 Appt. End Time: 1400  02/16/2017  Ms. [name] Wu, identified by name and date of birth, is a 36 y.o. female with a diagnosis of Diabetes:  Marland Kitchen Here with interpreter. G 3 P 2.  EDU 04-17-17  Interpeter  [place] [place] She says she has been hungry a lot. Finds it difficult at times to balance her meals and snacks. BS are doing well. FBS slighting 100+, so was put on Glyburide 2.5 mg once a day at night. Compliant with testing and following meal plan. Exercising by walking. Lost 4 lbs since last visit.   Diet is lower in carbs, calories and protein than recommended. She needs to eat more carbs, protein and vegetables as discussed. Wt Readings from Last 3 Encounters:  02/16/17 173 lb (78.5 kg)  02/14/17 174 lb (78.9 kg)  02/08/17 177 lb (80.3 kg)   Ht Readings from Last 3 Encounters:  02/08/17 5\' 5"  (1.651 m)  10/12/16 5\' 4"  (1.626 m)  12/11/13 5\' 5"  (1.651 m)   Body mass index is 28.79 kg/m. @BMIFA @ Facility age limit for growth percentiles is 20 years. Facility age limit for growth percentiles is 20 years.  Weight 173 lb (78.5 kg), last menstrual period 07/11/2016. Body mass index is 28.79 kg/m.       Diabetes Self-Management Education - 02/16/17 1334      Dietary Intake   Breakfast [place] quesdilla and salad,   Snack (morning) watermelon   Lunch broccoli, beans, and eggs water   Snack (afternoon) veggie snacks, cucumbers, carrots, apple with PB   Dinner chicken, veggties, water   Snack (evening) canteloupe, 1/2 banana   Beverage(s) water,     Exercise   Exercise Type Light (walking / raking leaves)   How many days per week to you exercise? 5   How many minutes per day do you exercise? --  20-30     Patient Education   Nutrition management  Food label reading, portion sizes and measuring food.;Carbohydrate counting;Reviewed blood glucose goals for pre and post meals and  how to evaluate the patients' food intake on their blood glucose level.;Meal timing in regards to the patients' current diabetes medication.   Physical activity and exercise  Role of exercise on diabetes management, blood pressure control and cardiac health.   Medications Reviewed patients medication for diabetes, action, purpose, timing of dose and side effects.   Monitoring Taught/discussed recording of test results and interpretation of SMBG.     Individualized Goals (developed by patient)   Nutrition General guidelines for healthy choices and portions discussed   Physical Activity Exercise 5-7 days per week;30 minutes per day   Medications take my medication as prescribed   Monitoring  test blood glucose pre and post meals as discussed     Patient Self-Evaluation of Goals - Patient rates self as meeting previously set goals (% of time)   Nutrition >75%   Physical Activity >75%   Medications >75%   Monitoring >75%   Problem Solving >75%   Reducing Risk >75%   Health Coping >75%     Post-Education Assessment   Patient understands the diabetes disease and treatment process. Demonstrates understanding / competency   Patient understands incorporating nutritional management into lifestyle. Demonstrates understanding / competency   Patient undertands incorporating physical activity into lifestyle. Demonstrates understanding / competency   Patient understands using medications safely. Demonstrates understanding / competency   Patient understands monitoring blood  glucose, interpreting and using results Demonstrates understanding / competency   Patient understands prevention, detection, and treatment of acute complications. Demonstrates understanding / competency   Patient understands prevention, detection, and treatment of chronic complications. Demonstrates understanding / competency   Patient understands how to develop strategies to address psychosocial issues. Demonstrates understanding /  competency   Patient understands how to develop strategies to promote health/change behavior. Demonstrates understanding / competency     Outcomes   Program Status Completed      Learning Objective:  Patient will have a greater understanding of diabetes self-management. Patient education plan is to attend individual and/or group sessions per assessed needs and concerns.   Plan:   Patient Instructions  Goals Keep up the good work Keep eating three meals and 2 snacks per day Eat 2-3 carb choices per meal and 1-2 carbs per snacks with protein Keep drinking water Keep walking 30 minutes a day Notify MD if blood sugars are higher than expected.     Expected Outcomes:  Demonstrated interest in learning. Expect positive outcomes  Education material provided: My Plate  If problems or questions, patient to contact team via:  Phone and Email  Future DSME appointment: - PRN

## 2017-02-16 NOTE — Patient Instructions (Signed)
Goals Keep up the good work Keep eating three meals and 2 snacks per day Eat 2-3 carb choices per meal and 1-2 carbs per snacks with protein Keep drinking water Keep walking 30 minutes a day Notify MD if blood sugars are higher than expected.

## 2017-02-22 ENCOUNTER — Ambulatory Visit (INDEPENDENT_AMBULATORY_CARE_PROVIDER_SITE_OTHER): Payer: Medicaid Other | Admitting: Obstetrics & Gynecology

## 2017-02-22 ENCOUNTER — Ambulatory Visit (INDEPENDENT_AMBULATORY_CARE_PROVIDER_SITE_OTHER): Payer: Medicaid Other

## 2017-02-22 ENCOUNTER — Encounter: Payer: Self-pay | Admitting: Obstetrics & Gynecology

## 2017-02-22 VITALS — BP 116/64 | HR 81 | Wt 172.0 lb

## 2017-02-22 DIAGNOSIS — O24419 Gestational diabetes mellitus in pregnancy, unspecified control: Secondary | ICD-10-CM | POA: Diagnosis not present

## 2017-02-22 DIAGNOSIS — Z1389 Encounter for screening for other disorder: Secondary | ICD-10-CM

## 2017-02-22 DIAGNOSIS — Z3403 Encounter for supervision of normal first pregnancy, third trimester: Secondary | ICD-10-CM

## 2017-02-22 DIAGNOSIS — K219 Gastro-esophageal reflux disease without esophagitis: Secondary | ICD-10-CM | POA: Diagnosis not present

## 2017-02-22 DIAGNOSIS — O0993 Supervision of high risk pregnancy, unspecified, third trimester: Secondary | ICD-10-CM

## 2017-02-22 DIAGNOSIS — Z331 Pregnant state, incidental: Secondary | ICD-10-CM

## 2017-02-22 LAB — POCT URINALYSIS DIPSTICK
GLUCOSE UA: NEGATIVE
Leukocytes, UA: NEGATIVE
Nitrite, UA: NEGATIVE
PROTEIN UA: NEGATIVE
RBC UA: NEGATIVE

## 2017-02-22 MED ORDER — OMEPRAZOLE 20 MG PO CPDR: 20.0000 mg | DELAYED_RELEASE_CAPSULE | Freq: Every day | ORAL | 6 refills | Status: DC

## 2017-02-22 NOTE — Progress Notes (Signed)
[place] 35+3 wks,cephalic,post pl gr 2, normal ovaries bilat,afi 12 cm,fhr 173 bpm,EFW 2040 g 53%

## 2017-02-22 NOTE — Progress Notes (Signed)
Fetal Surveillance Testing today:  Sonogram is normal   High Risk Pregnancy Diagnosis(es):   Class A2 DM  G3P2002 [redacted]w[redacted]d Estimated Date of Delivery: 04/17/17  Blood pressure 116/64, pulse 81, weight 172 lb (78 kg), last menstrual period 07/11/2016.  Urinalysis: Negative   HPI: The patient is being seen today for ongoing management of Class A2 DM. She is also having some coughing when spells when she lays down at night minimally productive which seems like is probably more related to reflux some of give her omeprazole to take after dinner Today she reports CBG are much better on the glyburide 2.5 mg qhs   BP weight and urine results all reviewed and noted. Patient reports good fetal movement, denies any bleeding and no rupture of membranes symptoms or regular contractions.  Fundal Height:  34 Fetal Heart rate:  173 Edema:  none  Patient is without complaints other than noted in her HPI. All questions were answered.  All lab and sonogram results have been reviewed. Comments:    Assessment:  1.  Pregnancy at [redacted]w[redacted]d,  Estimated Date of Delivery: 04/17/17 :                          2.  Class A2 DM, average EFW                        3.    Medication(s) Plans:  Continue glyburdie 2.5 mg qhs  Treatment Plan:  Twice weekly surveillance with EFW 36 weeks, deliver at 39 weeks or as clinically indicated  Return in about 2 days (around 02/24/2017) for NST, HROB. for appointment for high risk OB care  Meds ordered this encounter  Medications  . omeprazole (PRILOSEC) 20 MG capsule    Sig: Take 1 capsule (20 mg total) by mouth daily. 1 tablet a day    Dispense:  30 capsule    Refill:  6   Orders Placed This Encounter  Procedures  . POCT urinalysis dipstick

## 2017-02-24 ENCOUNTER — Encounter: Payer: Self-pay | Admitting: Obstetrics & Gynecology

## 2017-02-24 ENCOUNTER — Ambulatory Visit (INDEPENDENT_AMBULATORY_CARE_PROVIDER_SITE_OTHER): Payer: Medicaid Other | Admitting: Obstetrics & Gynecology

## 2017-02-24 VITALS — BP 98/50 | HR 84 | Wt 171.0 lb

## 2017-02-24 DIAGNOSIS — Z1389 Encounter for screening for other disorder: Secondary | ICD-10-CM | POA: Diagnosis not present

## 2017-02-24 DIAGNOSIS — Z331 Pregnant state, incidental: Secondary | ICD-10-CM | POA: Diagnosis not present

## 2017-02-24 DIAGNOSIS — J40 Bronchitis, not specified as acute or chronic: Secondary | ICD-10-CM

## 2017-02-24 DIAGNOSIS — O24419 Gestational diabetes mellitus in pregnancy, unspecified control: Secondary | ICD-10-CM

## 2017-02-24 DIAGNOSIS — O0993 Supervision of high risk pregnancy, unspecified, third trimester: Secondary | ICD-10-CM

## 2017-02-24 DIAGNOSIS — K219 Gastro-esophageal reflux disease without esophagitis: Secondary | ICD-10-CM

## 2017-02-24 LAB — POCT URINALYSIS DIPSTICK
GLUCOSE UA: NEGATIVE
Leukocytes, UA: NEGATIVE
Nitrite, UA: NEGATIVE
Protein, UA: NEGATIVE
RBC UA: NEGATIVE

## 2017-02-24 MED ORDER — OMEPRAZOLE 20 MG PO CPDR: DELAYED_RELEASE_CAPSULE | ORAL | 6 refills | Status: DC

## 2017-02-24 MED ORDER — AMOXICILLIN-POT CLAVULANATE 875-125 MG PO TABS: 1.0000 | ORAL_TABLET | Freq: Two times a day (BID) | ORAL | 0 refills | Status: DC

## 2017-02-24 MED ORDER — GLYBURIDE 5 MG PO TABS: ORAL_TABLET | ORAL | 3 refills | Status: DC

## 2017-02-24 NOTE — Patient Instructions (Signed)
Robitussin DM or Delsyn para la tos

## 2017-02-24 NOTE — Progress Notes (Signed)
Fetal Surveillance Testing today:  Reactive NST   High Risk Pregnancy Diagnosis(es):   Class A2 DM  G3P2002 [redacted]w[redacted]d Estimated Date of Delivery: 04/17/17  Blood pressure (!) 98/50, pulse 84, weight 171 lb (77.6 kg), last menstrual period 07/11/2016.  Urinalysis: Negative   HPI: The patient is being seen today for ongoing management of as above. Today she reports continued cough, minimally productive, no fever chills, no one else in the house is sick I would consider steroids for her cough but it would make her CBG high, CBG fastings are still a bit high, running >95   BP weight and urine results all reviewed and noted. Patient reports good fetal movement, denies any bleeding and no rupture of membranes symptoms or regular contractions.  Fundal Height:  35 Fetal Heart rate:  145 Edema:  none  Patient is without complaints other than noted in her HPI. All questions were answered.  All lab and sonogram results have been reviewed. Comments:    Assessment:  1.  Pregnancy at [redacted]w[redacted]d,  Estimated Date of Delivery: 04/17/17 :                          2.  Class A2 DM                        3.  Bronchitis  Medication(s) Plans:  Increase p.m. glyburide to 5 mg, Augmentin 875/125 twice a day for 10 days, use over-the-counter Robitussin-DM or Delsym as a cough suppressant  Treatment Plan:  Twice weekly NST with repeat estimated fetal weight sonogram at 36-37 weeks and plan induction at 39 weeks and less otherwise indicated  Return in about 4 days (around 02/28/2017) for NST, HROB. for appointment for high risk OB care  Meds ordered this encounter  Medications  . omeprazole (PRILOSEC) 20 MG capsule    Sig: 1 tablet twice daily    Dispense:  60 capsule    Refill:  6  . glyBURIDE (DIABETA) 5 MG tablet    Sig: Take 1 tablet at bedtime(10PM)    Dispense:  30 tablet    Refill:  3  . amoxicillin-clavulanate (AUGMENTIN) 875-125 MG tablet    Sig: Take 1 tablet by mouth 2 (two) times daily.   Dispense:  20 tablet    Refill:  0   Orders Placed This Encounter  Procedures  . POCT urinalysis dipstick

## 2017-02-28 ENCOUNTER — Ambulatory Visit (INDEPENDENT_AMBULATORY_CARE_PROVIDER_SITE_OTHER): Payer: Medicaid Other | Admitting: Obstetrics and Gynecology

## 2017-02-28 VITALS — BP 108/52 | HR 96 | Wt 170.4 lb

## 2017-02-28 DIAGNOSIS — Z1389 Encounter for screening for other disorder: Secondary | ICD-10-CM

## 2017-02-28 DIAGNOSIS — Z331 Pregnant state, incidental: Secondary | ICD-10-CM | POA: Diagnosis not present

## 2017-02-28 DIAGNOSIS — O24415 Gestational diabetes mellitus in pregnancy, controlled by oral hypoglycemic drugs: Secondary | ICD-10-CM | POA: Diagnosis not present

## 2017-02-28 DIAGNOSIS — J209 Acute bronchitis, unspecified: Secondary | ICD-10-CM | POA: Diagnosis not present

## 2017-02-28 DIAGNOSIS — Z3483 Encounter for supervision of other normal pregnancy, third trimester: Secondary | ICD-10-CM

## 2017-02-28 LAB — POCT URINALYSIS DIPSTICK
GLUCOSE UA: NEGATIVE
Leukocytes, UA: NEGATIVE
NITRITE UA: NEGATIVE
Protein, UA: NEGATIVE
RBC UA: NEGATIVE

## 2017-02-28 MED ORDER — HYDROCODONE-HOMATROPINE 5-1.5 MG/5ML PO SYRP: 5.0000 mL | ORAL_SOLUTION | Freq: Four times a day (QID) | ORAL | 0 refills | Status: DC | PRN

## 2017-02-28 NOTE — Progress Notes (Addendum)
High Risk Pregnancy HROB Diagnosis(es):   Class A2 DM   G3P2002 [redacted]w[redacted]d Estimated Date of Delivery: 04/17/17    HPI: The patient is being seen today for ongoing management of above. Today she reports a persistent cough. She was seen last week and was given Augmentin, but has not been using a cough medicine.  Patient reports good fetal movement, denies any bleeding and no rupture of membranes symptoms or regular contractions. She was  Advised to take OTC Robitussin DM but did not ,?reason.? Translator present BP weight and urine results reviewed and noted. Blood pressure (!) 108/52, pulse 96, weight 170 lb 6.4 oz (77.3 kg), last menstrual period 07/11/2016. Chest clear to auscultation, multiple coughing spasms, nonproductive. Fundal Height:  33 Fetal Heart rate:  150 Physical Examination: Abdomen - soft, nontender, nondistended, no masses or organomegaly                                     Pelvic - examination not indicated                                        Urinalysis: large ketones  Fetal Surveillance Testing today:  NST, reactive  Lab and sonogram results have been reviewed. Comments:   Assessment:  1.  Pregnancy at [redacted]w[redacted]d,  G3P2002   :  Estimated Date of Delivery: 04/17/17                         2.  Class A2 DM                        3.  Bronchitis   Medication(s) Plans:  Continue with PM glyburide at 5mg , add Hycodan for cough   Treatment Plan:  Twice weekly NST with repeat estimated fetal weight sonogram at 36-37 weeks and plan induction at 39 weeks unless otherwise indicated.   Follow up in 3 days for appointment for high risk OB care, NST  By signing my name below, I, Sonum Patel, attest that this documentation has been prepared under the direction and in the presence of Jonnie Kind, MD. Electronically Signed: Sonum Patel, Education administrator. 02/28/17. 3:51 PM.  I personally performed the services described in this documentation, which was SCRIBED in my presence. The recorded  information has been reviewed and considered accurate. It has been edited as necessary during review. Jonnie Kind, MD

## 2017-03-03 ENCOUNTER — Ambulatory Visit (INDEPENDENT_AMBULATORY_CARE_PROVIDER_SITE_OTHER): Payer: Medicaid Other | Admitting: Obstetrics & Gynecology

## 2017-03-03 ENCOUNTER — Encounter: Payer: Self-pay | Admitting: Obstetrics & Gynecology

## 2017-03-03 VITALS — BP 94/46 | HR 90 | Wt 170.0 lb

## 2017-03-03 DIAGNOSIS — Z331 Pregnant state, incidental: Secondary | ICD-10-CM | POA: Diagnosis not present

## 2017-03-03 DIAGNOSIS — Z1389 Encounter for screening for other disorder: Secondary | ICD-10-CM | POA: Diagnosis not present

## 2017-03-03 DIAGNOSIS — Z3A33 33 weeks gestation of pregnancy: Secondary | ICD-10-CM

## 2017-03-03 DIAGNOSIS — O0993 Supervision of high risk pregnancy, unspecified, third trimester: Secondary | ICD-10-CM

## 2017-03-03 DIAGNOSIS — Z794 Long term (current) use of insulin: Secondary | ICD-10-CM

## 2017-03-03 DIAGNOSIS — O09893 Supervision of other high risk pregnancies, third trimester: Secondary | ICD-10-CM | POA: Diagnosis not present

## 2017-03-03 DIAGNOSIS — O24419 Gestational diabetes mellitus in pregnancy, unspecified control: Secondary | ICD-10-CM

## 2017-03-03 LAB — POCT URINALYSIS DIPSTICK
Blood, UA: NEGATIVE
Glucose, UA: NEGATIVE
Ketones, UA: NEGATIVE
Leukocytes, UA: NEGATIVE
Nitrite, UA: NEGATIVE
Protein, UA: NEGATIVE

## 2017-03-03 NOTE — Progress Notes (Signed)
Fetal Surveillance Testing today:  Reactive NST   High Risk Pregnancy Diagnosis(es):   Class A2 DM, AMA  G3P2002 [redacted]w[redacted]d Estimated Date of Delivery: 04/17/17  Blood pressure (!) 94/46, pulse 90, weight 170 lb (77.1 kg), last menstrual period 07/11/2016.  Urinalysis: Negative   HPI: The patient is being seen today for ongoing management of as above. Today she reports CBG are much better on 5 mg glyburide   BP weight and urine results all reviewed and noted. Patient reports good fetal movement, denies any bleeding and no rupture of membranes symptoms or regular contractions.  Fundal Height:  36 Fetal Heart rate:  140 Edema:  none  Patient is without complaints other than noted in her HPI. All questions were answered.  All lab and sonogram results have been reviewed. Comments:    Assessment:  1.  Pregnancy at [redacted]w[redacted]d,  Estimated Date of Delivery: 04/17/17 :                          2.  Class A2 DM                        3.  AMA  Medication(s) Plans:  Glyburide 5 mg 2200  Treatment Plan:  Twice weekly fetal surveillance with NST, EFW sonogram 36 weeks, deliver at 39 weeks unless otherwise clinically indicated  Return in about 4 days (around 03/07/2017) for NST, HROB, with Dr Elonda Husky. for appointment for high risk OB care  No orders of the defined types were placed in this encounter.  Orders Placed This Encounter  Procedures  . POCT urinalysis dipstick

## 2017-03-07 ENCOUNTER — Ambulatory Visit (INDEPENDENT_AMBULATORY_CARE_PROVIDER_SITE_OTHER): Payer: Medicaid Other | Admitting: Obstetrics & Gynecology

## 2017-03-07 ENCOUNTER — Encounter: Payer: Self-pay | Admitting: Obstetrics & Gynecology

## 2017-03-07 VITALS — BP 110/60 | HR 69 | Wt 170.0 lb

## 2017-03-07 DIAGNOSIS — Z794 Long term (current) use of insulin: Secondary | ICD-10-CM | POA: Diagnosis not present

## 2017-03-07 DIAGNOSIS — Z1389 Encounter for screening for other disorder: Secondary | ICD-10-CM | POA: Diagnosis not present

## 2017-03-07 DIAGNOSIS — O0993 Supervision of high risk pregnancy, unspecified, third trimester: Secondary | ICD-10-CM

## 2017-03-07 DIAGNOSIS — Z331 Pregnant state, incidental: Secondary | ICD-10-CM | POA: Diagnosis not present

## 2017-03-07 DIAGNOSIS — O09893 Supervision of other high risk pregnancies, third trimester: Secondary | ICD-10-CM

## 2017-03-07 DIAGNOSIS — O24419 Gestational diabetes mellitus in pregnancy, unspecified control: Secondary | ICD-10-CM

## 2017-03-07 LAB — POCT URINALYSIS DIPSTICK
GLUCOSE UA: NEGATIVE
KETONES UA: NEGATIVE
LEUKOCYTES UA: NEGATIVE
NITRITE UA: NEGATIVE
Protein, UA: NEGATIVE
RBC UA: NEGATIVE

## 2017-03-07 NOTE — Progress Notes (Signed)
Fetal Surveillance Testing today:  Reactive NST   High Risk Pregnancy Diagnosis(es):   Class A2 DM  G3P2002 [redacted]w[redacted]d Estimated Date of Delivery: 04/17/17  Blood pressure 110/60, pulse 69, weight 170 lb (77.1 kg), last menstrual period 07/11/2016.  Urinalysis: Negative   HPI: The patient is being seen today for ongoing management of Class . Today she reports cbr are excellent   BP weight and urine results all reviewed and noted. Patient reports good fetal movement, denies any bleeding and no rupture of membranes symptoms or regular contractions.  Fundal Height:  36 Fetal Heart rate:  135 Edema:  none  Patient is without complaints other than noted in her HPI. All questions were answered.  All lab and sonogram results have been reviewed. Comments:    Assessment:  1.  Pregnancy at 102w1d,  Estimated Date of Delivery: 04/17/17 :                          2.  Class A2 DM                        3.    Medication(s) Plans:  Glyburide 5 mg at 2200  Treatment Plan:  Continue twice weekly assessments with EFW 36 weeks, induction 39 weeks unless clinically indicated otherwise  Return in about 1 week (around 03/14/2017) for NST, HROB, with Dr Elonda Husky. for appointment for high risk OB care  No orders of the defined types were placed in this encounter.  Orders Placed This Encounter  Procedures  . POCT urinalysis dipstick

## 2017-03-14 ENCOUNTER — Ambulatory Visit (INDEPENDENT_AMBULATORY_CARE_PROVIDER_SITE_OTHER): Payer: Medicaid Other | Admitting: Obstetrics & Gynecology

## 2017-03-14 ENCOUNTER — Encounter: Payer: Self-pay | Admitting: Obstetrics & Gynecology

## 2017-03-14 VITALS — BP 90/60 | HR 72 | Wt 168.0 lb

## 2017-03-14 DIAGNOSIS — O09893 Supervision of other high risk pregnancies, third trimester: Secondary | ICD-10-CM

## 2017-03-14 DIAGNOSIS — Z331 Pregnant state, incidental: Secondary | ICD-10-CM | POA: Diagnosis not present

## 2017-03-14 DIAGNOSIS — Z1389 Encounter for screening for other disorder: Secondary | ICD-10-CM | POA: Diagnosis not present

## 2017-03-14 DIAGNOSIS — O24419 Gestational diabetes mellitus in pregnancy, unspecified control: Secondary | ICD-10-CM | POA: Diagnosis not present

## 2017-03-14 DIAGNOSIS — Z794 Long term (current) use of insulin: Secondary | ICD-10-CM

## 2017-03-14 DIAGNOSIS — O0993 Supervision of high risk pregnancy, unspecified, third trimester: Secondary | ICD-10-CM

## 2017-03-14 DIAGNOSIS — Z3A35 35 weeks gestation of pregnancy: Secondary | ICD-10-CM | POA: Diagnosis not present

## 2017-03-14 LAB — POCT URINALYSIS DIPSTICK
GLUCOSE UA: NEGATIVE
Ketones, UA: NEGATIVE
Leukocytes, UA: NEGATIVE
Nitrite, UA: NEGATIVE
Protein, UA: NEGATIVE
RBC UA: NEGATIVE

## 2017-03-14 NOTE — Progress Notes (Signed)
Fetal Surveillance Testing today:  Reactive NST   High Risk Pregnancy Diagnosis(es):   Class AII diabetes  G3P2002 [redacted]w[redacted]d Estimated Date of Delivery: 04/17/17  Blood pressure 90/60, pulse 72, weight 168 lb (76.2 kg), last menstrual period 07/11/2016.  Urinalysis: Negative   HPI: The patient is being seen today for ongoing management of class a 2 diabetes. Today she reports all of her CBGs are excellent   BP weight and urine results all reviewed and noted. Patient reports good fetal movement, denies any bleeding and no rupture of membranes symptoms or regular contractions.  Fundal Height:  36 135 Fetal Heart rate:  135 Edema:  No swelling  Patient is without complaints other than noted in her HPI. All questions were answered.  All lab and sonogram results have been reviewed. Comments:    Assessment:  1.  Pregnancy at [redacted]w[redacted]d,  Estimated Date of Delivery: 04/17/17 :                          2.  Last a 2 diabetes under excellent control                        3.    Medication(s) Plans: Glyburide 5 mg at bedtime   Treatment Plan:  Patient was increased for her testing and blood sugars have been so great we will just see her weekly she is due for an ultrasound next week for an estimated fetal weight.  We will schedule that for next Tuesday, July 17  Return in about 8 days (around 03/22/2017) for BPP/sono, HROB, with Dr Elonda Husky. for appointment for high risk OB care  No orders of the defined types were placed in this encounter.  Orders Placed This Encounter  Procedures  . US OB Follow Up  . US Fetal BPP W/O Non Stress  . POCT urinalysis dipstick

## 2017-03-22 ENCOUNTER — Encounter: Payer: Self-pay | Admitting: Obstetrics & Gynecology

## 2017-03-22 ENCOUNTER — Ambulatory Visit (INDEPENDENT_AMBULATORY_CARE_PROVIDER_SITE_OTHER): Payer: Medicaid Other

## 2017-03-22 ENCOUNTER — Ambulatory Visit (INDEPENDENT_AMBULATORY_CARE_PROVIDER_SITE_OTHER): Payer: Medicaid Other | Admitting: Obstetrics & Gynecology

## 2017-03-22 VITALS — BP 104/60 | HR 63 | Wt 166.5 lb

## 2017-03-22 DIAGNOSIS — O24419 Gestational diabetes mellitus in pregnancy, unspecified control: Secondary | ICD-10-CM | POA: Diagnosis not present

## 2017-03-22 DIAGNOSIS — Z331 Pregnant state, incidental: Secondary | ICD-10-CM

## 2017-03-22 DIAGNOSIS — O24415 Gestational diabetes mellitus in pregnancy, controlled by oral hypoglycemic drugs: Secondary | ICD-10-CM | POA: Diagnosis not present

## 2017-03-22 DIAGNOSIS — Z1389 Encounter for screening for other disorder: Secondary | ICD-10-CM

## 2017-03-22 DIAGNOSIS — O0993 Supervision of high risk pregnancy, unspecified, third trimester: Secondary | ICD-10-CM

## 2017-03-22 DIAGNOSIS — Z3A36 36 weeks gestation of pregnancy: Secondary | ICD-10-CM | POA: Diagnosis not present

## 2017-03-22 DIAGNOSIS — Z3403 Encounter for supervision of normal first pregnancy, third trimester: Secondary | ICD-10-CM

## 2017-03-22 LAB — POCT URINALYSIS DIPSTICK
Glucose, UA: NEGATIVE
KETONES UA: NEGATIVE
Leukocytes, UA: NEGATIVE
NITRITE UA: NEGATIVE
Protein, UA: NEGATIVE
RBC UA: NEGATIVE

## 2017-03-22 NOTE — Progress Notes (Signed)
Fetal Surveillance Testing today: Biophysical profile of 8 out of 8   High Risk Pregnancy Diagnosis(es):   Class A2 DM  G3P2002 [redacted]w[redacted]d Estimated Date of Delivery: 04/17/17  Blood pressure 104/60, pulse 63, weight 166 lb 8 oz (75.5 kg), last menstrual period 07/11/2016.  Urinalysis: Negative   HPI: The patient is being seen today for ongoing management of as above. Today she reports CBGs are all normal   BP weight and urine results all reviewed and noted. Patient reports good fetal movement, denies any bleeding and no rupture of membranes symptoms or regular contractions.  Fundal Height: 37 cm Fetal Heart rate: 145 Edema: None  Patient is without complaints other than noted in her HPI. All questions were answered.  All lab and sonogram results have been reviewed. Comments:    Assessment:  1.  Pregnancy at [redacted]w[redacted]d,  Estimated Date of Delivery: 04/17/17 :                          2.  Class A2 DM                        3.    Medication(s) Plans: Glyburide 5 mg at bedtime  Treatment Plan: Due to the patient's distance of travel and her husband's availability we are doing weekly BPP's right now so we will continue that, we will do an estimated fetal weight and also plan to induce at 39 weeks unless otherwise clinically indicated  Return in about 1 week (around 03/29/2017) for NST, HROB, with Dr Elonda Husky. for appointment for high risk OB care  No orders of the defined types were placed in this encounter.  Orders Placed This Encounter  Procedures  . POCT Urinalysis Dipstick

## 2017-03-22 NOTE — Progress Notes (Signed)
[place] 43+7 wks,cephalic,fhr 005 bpm,normal ovaries bilat,BPP 8/8,post pl gr 3,afi 9.8 cm,efw 2968 g 54 %

## 2017-03-29 ENCOUNTER — Ambulatory Visit (INDEPENDENT_AMBULATORY_CARE_PROVIDER_SITE_OTHER): Payer: Medicaid Other | Admitting: Obstetrics & Gynecology

## 2017-03-29 ENCOUNTER — Encounter (INDEPENDENT_AMBULATORY_CARE_PROVIDER_SITE_OTHER): Payer: Self-pay

## 2017-03-29 ENCOUNTER — Encounter: Payer: Self-pay | Admitting: Obstetrics & Gynecology

## 2017-03-29 VITALS — BP 112/60 | HR 63 | Wt 168.0 lb

## 2017-03-29 DIAGNOSIS — O09893 Supervision of other high risk pregnancies, third trimester: Secondary | ICD-10-CM | POA: Diagnosis not present

## 2017-03-29 DIAGNOSIS — Z331 Pregnant state, incidental: Secondary | ICD-10-CM

## 2017-03-29 DIAGNOSIS — Z3A37 37 weeks gestation of pregnancy: Secondary | ICD-10-CM

## 2017-03-29 DIAGNOSIS — O24419 Gestational diabetes mellitus in pregnancy, unspecified control: Secondary | ICD-10-CM

## 2017-03-29 DIAGNOSIS — Z1389 Encounter for screening for other disorder: Secondary | ICD-10-CM

## 2017-03-29 DIAGNOSIS — O0993 Supervision of high risk pregnancy, unspecified, third trimester: Secondary | ICD-10-CM

## 2017-03-29 DIAGNOSIS — Z3483 Encounter for supervision of other normal pregnancy, third trimester: Secondary | ICD-10-CM

## 2017-03-29 LAB — POCT URINALYSIS DIPSTICK
Blood, UA: NEGATIVE
Glucose, UA: NEGATIVE
KETONES UA: NEGATIVE
LEUKOCYTES UA: NEGATIVE
Nitrite, UA: NEGATIVE
PROTEIN UA: NEGATIVE

## 2017-03-29 LAB — OB RESULTS CONSOLE GBS: GBS: NEGATIVE

## 2017-03-29 NOTE — Treatment Plan (Signed)
   Induction Assessment Scheduling Form: Fax to [facility]:  [phone]  Toniann Dickerson                                                                                   DOB:  July 11, 1981                                                            MRN:  [id number]                                                                     Phone #:       [phone]                     Provider:  [facility]  GP:  [id number]                                                            Estimated Date of Delivery: 04/17/17  Dating Criteria: LMP, 14 weeks sonogram    Medical Indications for induction:  Class A2 DM Admission Date/Time:  04/10/2017 @ 0730 Gestational age on admission:  [redacted]w[redacted]d   Filed Weights   03/29/17 1513  Weight: 168 lb (76.2 kg)   HIV:neg    MBW:[id number]    1.5 cm dilated, 50 effaced, -3 station, cervical position post, consistency soft, Bishop score 6, presenting part vertex   Method of induction(proposed):  cytotec   Scheduling Provider Signature:  Florian Buff, MD                                            Today's Date:  03/29/2017

## 2017-03-29 NOTE — Progress Notes (Signed)
Fetal Surveillance Testing today:  Reactive NST   High Risk Pregnancy Diagnosis(es):   Class A2 DM  G3P2002 [redacted]w[redacted]d Estimated Date of Delivery: 04/17/17  Blood pressure 112/60, pulse 63, weight 168 lb (76.2 kg), last menstrual period 07/11/2016.  Urinalysis: Negative   HPI: The patient is being seen today for ongoing management of class A2 DM. Today she reports CBG are excellent   BP weight and urine results all reviewed and noted. Patient reports good fetal movement, denies any bleeding and no rupture of membranes symptoms or regular contractions.  Fundal Height:  38 Fetal Heart rate:  140 Edema:  none  Patient is without complaints other than noted in her HPI. All questions were answered.  All lab and sonogram results have been reviewed. Comments:    Assessment:  1.  Pregnancy at [redacted]w[redacted]d,  Estimated Date of Delivery: 04/17/17 :                          2.  Class A2 DM, EFW 54%                        3.    Medication(s) Plans:  Glyburide 5 mg qhs  Treatment Plan:  NST next week, induction  04/10/2017@0730   Return in about 9 days (around 04/07/2017) for NST, HROB, with Dr Elonda Husky. for appointment for high risk OB care  No orders of the defined types were placed in this encounter.  Orders Placed This Encounter  Procedures  . GC/Chlamydia Probe Amp  . Culture, beta strep (group b only)  . POCT urinalysis dipstick

## 2017-03-30 ENCOUNTER — Telehealth (HOSPITAL_COMMUNITY): Payer: Self-pay | Admitting: *Deleted

## 2017-03-30 ENCOUNTER — Encounter (HOSPITAL_COMMUNITY): Payer: Self-pay | Admitting: *Deleted

## 2017-03-30 NOTE — Telephone Encounter (Signed)
Preadmission screen Interpreter number [phone]

## 2017-04-01 LAB — GC/CHLAMYDIA PROBE AMP
Chlamydia trachomatis, NAA: NEGATIVE
Neisseria gonorrhoeae by PCR: NEGATIVE

## 2017-04-02 LAB — CULTURE, BETA STREP (GROUP B ONLY): STREP GP B CULTURE: NEGATIVE

## 2017-04-04 ENCOUNTER — Inpatient Hospital Stay (HOSPITAL_COMMUNITY)
Admission: AD | Admit: 2017-04-04 | Discharge: 2017-04-06 | DRG: 767 | Disposition: A | Discharge status: Home / Self Care | Payer: Medicaid Other | Attending: Obstetrics and Gynecology | Admitting: Obstetrics and Gynecology

## 2017-04-04 ENCOUNTER — Inpatient Hospital Stay (HOSPITAL_COMMUNITY): Payer: Medicaid Other | Admitting: Anesthesiology

## 2017-04-04 ENCOUNTER — Encounter (HOSPITAL_COMMUNITY): Payer: Self-pay

## 2017-04-04 DIAGNOSIS — O9962 Diseases of the digestive system complicating childbirth: Secondary | ICD-10-CM | POA: Diagnosis present

## 2017-04-04 DIAGNOSIS — O24425 Gestational diabetes mellitus in childbirth, controlled by oral hypoglycemic drugs: Principal | ICD-10-CM | POA: Diagnosis present

## 2017-04-04 DIAGNOSIS — Z3A38 38 weeks gestation of pregnancy: Secondary | ICD-10-CM

## 2017-04-04 DIAGNOSIS — Z302 Encounter for sterilization: Secondary | ICD-10-CM

## 2017-04-04 DIAGNOSIS — K219 Gastro-esophageal reflux disease without esophagitis: Secondary | ICD-10-CM | POA: Diagnosis not present

## 2017-04-04 DIAGNOSIS — J209 Acute bronchitis, unspecified: Secondary | ICD-10-CM

## 2017-04-04 DIAGNOSIS — Z3403 Encounter for supervision of normal first pregnancy, third trimester: Secondary | ICD-10-CM

## 2017-04-04 DIAGNOSIS — O24415 Gestational diabetes mellitus in pregnancy, controlled by oral hypoglycemic drugs: Secondary | ICD-10-CM

## 2017-04-04 LAB — CBC
HEMATOCRIT: 37.1 % (ref 36.0–46.0)
Hemoglobin: 12.2 g/dL (ref 12.0–15.0)
MCH: 26.9 pg (ref 26.0–34.0)
MCHC: 32.9 g/dL (ref 30.0–36.0)
MCV: 81.7 fL (ref 78.0–100.0)
Platelets: 243 10*3/uL (ref 150–400)
RBC: 4.54 MIL/uL (ref 3.87–5.11)
RDW: 13.8 % (ref 11.5–15.5)
WBC: 6.8 10*3/uL (ref 4.0–10.5)

## 2017-04-04 LAB — TYPE AND SCREEN
ABO/RH(D): O POS
ANTIBODY SCREEN: NEGATIVE

## 2017-04-04 LAB — GLUCOSE, CAPILLARY
GLUCOSE-CAPILLARY: 66 mg/dL (ref 65–99)
Glucose-Capillary: 70 mg/dL (ref 65–99)

## 2017-04-04 LAB — RPR: RPR Ser Ql: NONREACTIVE

## 2017-04-04 LAB — ABO/RH: ABO/RH(D): O POS

## 2017-04-04 MED ORDER — ZOLPIDEM TARTRATE 5 MG PO TABS: 5.0000 mg | ORAL_TABLET | Freq: Every evening | ORAL | Status: DC | PRN

## 2017-04-04 MED ORDER — LACTATED RINGERS IV SOLN
500.0000 mL | Freq: Once | INTRAVENOUS | Status: AC
  Administered 2017-04-04: 500 mL via INTRAVENOUS

## 2017-04-04 MED ORDER — TETANUS-DIPHTH-ACELL PERTUSSIS 5-2.5-18.5 LF-MCG/0.5 IM SUSP
0.5000 mL | Freq: Once | INTRAMUSCULAR | Status: AC
  Administered 2017-04-05: 0.5 mL via INTRAMUSCULAR
  Filled 2017-04-04: qty 0.5

## 2017-04-04 MED ORDER — IBUPROFEN 600 MG PO TABS
600.0000 mg | ORAL_TABLET | Freq: Four times a day (QID) | ORAL | Status: DC
  Administered 2017-04-04 – 2017-04-06 (×6): 600 mg via ORAL
  Filled 2017-04-04 (×7): qty 1

## 2017-04-04 MED ORDER — LIDOCAINE HCL (PF) 1 % IJ SOLN
30.0000 mL | INTRAMUSCULAR | Status: DC | PRN
  Filled 2017-04-04: qty 30

## 2017-04-04 MED ORDER — SIMETHICONE 80 MG PO CHEW: 80.0000 mg | CHEWABLE_TABLET | ORAL | Status: DC | PRN

## 2017-04-04 MED ORDER — OXYCODONE-ACETAMINOPHEN 5-325 MG PO TABS: 1.0000 | ORAL_TABLET | ORAL | Status: DC | PRN

## 2017-04-04 MED ORDER — EPHEDRINE 5 MG/ML INJ
10.0000 mg | INTRAVENOUS | Status: DC | PRN
  Filled 2017-04-04: qty 2

## 2017-04-04 MED ORDER — OXYTOCIN BOLUS FROM INFUSION
500.0000 mL | Freq: Once | INTRAVENOUS | Status: AC
  Administered 2017-04-04: 500 mL via INTRAVENOUS

## 2017-04-04 MED ORDER — BENZOCAINE-MENTHOL 20-0.5 % EX AERO
1.0000 "application " | INHALATION_SPRAY | CUTANEOUS | Status: DC | PRN
  Administered 2017-04-04: 1 via TOPICAL
  Filled 2017-04-04: qty 56

## 2017-04-04 MED ORDER — DIPHENHYDRAMINE HCL 25 MG PO CAPS: 25.0000 mg | ORAL_CAPSULE | Freq: Four times a day (QID) | ORAL | Status: DC | PRN

## 2017-04-04 MED ORDER — LACTATED RINGERS IV SOLN
INTRAVENOUS | Status: DC
  Administered 2017-04-04: 07:00:00 via INTRAVENOUS

## 2017-04-04 MED ORDER — FENTANYL CITRATE (PF) 100 MCG/2ML IJ SOLN: 100.0000 ug | INTRAMUSCULAR | Status: DC | PRN

## 2017-04-04 MED ORDER — FENTANYL 2.5 MCG/ML BUPIVACAINE 1/10 % EPIDURAL INFUSION (WH - ANES)
14.0000 mL/h | INTRAMUSCULAR | Status: DC | PRN
  Administered 2017-04-04 (×2): 14 mL/h via EPIDURAL
  Filled 2017-04-04: qty 100

## 2017-04-04 MED ORDER — ONDANSETRON HCL 4 MG/2ML IJ SOLN
4.0000 mg | Freq: Four times a day (QID) | INTRAMUSCULAR | Status: DC | PRN
  Administered 2017-04-04: 4 mg via INTRAVENOUS
  Filled 2017-04-04: qty 2

## 2017-04-04 MED ORDER — DIPHENHYDRAMINE HCL 50 MG/ML IJ SOLN: 12.5000 mg | INTRAMUSCULAR | Status: DC | PRN

## 2017-04-04 MED ORDER — SOD CITRATE-CITRIC ACID 500-334 MG/5ML PO SOLN: 30.0000 mL | ORAL | Status: DC | PRN

## 2017-04-04 MED ORDER — WITCH HAZEL-GLYCERIN EX PADS: 1.0000 "application " | MEDICATED_PAD | CUTANEOUS | Status: DC | PRN

## 2017-04-04 MED ORDER — DIBUCAINE 1 % RE OINT: 1.0000 "application " | TOPICAL_OINTMENT | RECTAL | Status: DC | PRN

## 2017-04-04 MED ORDER — PRENATAL MULTIVITAMIN CH: 1.0000 | ORAL_TABLET | Freq: Every day | ORAL | Status: DC

## 2017-04-04 MED ORDER — PHENYLEPHRINE 40 MCG/ML (10ML) SYRINGE FOR IV PUSH (FOR BLOOD PRESSURE SUPPORT)
80.0000 ug | PREFILLED_SYRINGE | INTRAVENOUS | Status: DC | PRN
  Filled 2017-04-04: qty 5

## 2017-04-04 MED ORDER — PHENYLEPHRINE 40 MCG/ML (10ML) SYRINGE FOR IV PUSH (FOR BLOOD PRESSURE SUPPORT)
PREFILLED_SYRINGE | INTRAVENOUS | Status: AC
  Filled 2017-04-04: qty 10

## 2017-04-04 MED ORDER — FENTANYL 2.5 MCG/ML BUPIVACAINE 1/10 % EPIDURAL INFUSION (WH - ANES)
INTRAMUSCULAR | Status: AC
  Filled 2017-04-04: qty 100

## 2017-04-04 MED ORDER — OXYCODONE-ACETAMINOPHEN 5-325 MG PO TABS: 2.0000 | ORAL_TABLET | ORAL | Status: DC | PRN

## 2017-04-04 MED ORDER — MISOPROSTOL 25 MCG QUARTER TABLET
25.0000 ug | ORAL_TABLET | ORAL | Status: DC | PRN
  Filled 2017-04-04: qty 1

## 2017-04-04 MED ORDER — LIDOCAINE HCL (PF) 1 % IJ SOLN
INTRAMUSCULAR | Status: DC | PRN
  Administered 2017-04-04: 3 mL via EPIDURAL
  Administered 2017-04-04: 5 mL via EPIDURAL
  Administered 2017-04-04: 2 mL via EPIDURAL

## 2017-04-04 MED ORDER — FLEET ENEMA 7-19 GM/118ML RE ENEM: 1.0000 | ENEMA | RECTAL | Status: DC | PRN

## 2017-04-04 MED ORDER — OXYTOCIN 40 UNITS IN LACTATED RINGERS INFUSION - SIMPLE MED
1.0000 m[IU]/min | INTRAVENOUS | Status: DC
  Administered 2017-04-04: 2 m[IU]/min via INTRAVENOUS
  Filled 2017-04-04: qty 1000

## 2017-04-04 MED ORDER — COCONUT OIL OIL: 1.0000 "application " | TOPICAL_OIL | Status: DC | PRN

## 2017-04-04 MED ORDER — ACETAMINOPHEN 325 MG PO TABS
650.0000 mg | ORAL_TABLET | ORAL | Status: DC | PRN
  Administered 2017-04-05: 650 mg via ORAL
  Filled 2017-04-04: qty 2

## 2017-04-04 MED ORDER — OXYTOCIN 40 UNITS IN LACTATED RINGERS INFUSION - SIMPLE MED: 2.5000 [IU]/h | INTRAVENOUS | Status: DC

## 2017-04-04 MED ORDER — ONDANSETRON HCL 4 MG PO TABS
4.0000 mg | ORAL_TABLET | ORAL | Status: DC | PRN
Start: 2017-04-04 — End: 2017-04-06

## 2017-04-04 MED ORDER — ACETAMINOPHEN 325 MG PO TABS: 650.0000 mg | ORAL_TABLET | ORAL | Status: DC | PRN

## 2017-04-04 MED ORDER — LACTATED RINGERS IV SOLN
500.0000 mL | INTRAVENOUS | Status: DC | PRN
  Administered 2017-04-04: 500 mL via INTRAVENOUS

## 2017-04-04 MED ORDER — SENNOSIDES-DOCUSATE SODIUM 8.6-50 MG PO TABS
2.0000 | ORAL_TABLET | ORAL | Status: DC
  Administered 2017-04-04 – 2017-04-05 (×2): 2 via ORAL
  Filled 2017-04-04 (×2): qty 2

## 2017-04-04 MED ORDER — LACTATED RINGERS IV SOLN: 500.0000 mL | Freq: Once | INTRAVENOUS | Status: AC

## 2017-04-04 MED ORDER — ONDANSETRON HCL 4 MG/2ML IJ SOLN
4.0000 mg | INTRAMUSCULAR | Status: DC | PRN
  Administered 2017-04-05 (×2): 4 mg via INTRAVENOUS
  Filled 2017-04-04: qty 2

## 2017-04-04 NOTE — Anesthesia Postprocedure Evaluation (Signed)
Anesthesia Post Note  Patient: [name] Wu  Procedure(s) Performed: * No procedures listed *     Patient location during evaluation: Mother Baby Anesthesia Type: Epidural Level of consciousness: awake, awake and alert, oriented and patient cooperative Pain management: pain level controlled Vital Signs Assessment: post-procedure vital signs reviewed and stable Respiratory status: spontaneous breathing, nonlabored ventilation and respiratory function stable Cardiovascular status: stable Postop Assessment: no headache, no backache, epidural receding, patient able to bend at knees and no signs of nausea or vomiting Anesthetic complications: no Comments: Spanish interpreter utilized.    Last Vitals:  Vitals:   04/04/17 1401 04/04/17 1430  BP: (!) 116/42 (!) 123/56  Pulse: 63 (!) 57  Resp: 18 18  Temp:  36.8 C    Last Pain:  Vitals:   04/04/17 1430  TempSrc: Oral  PainSc: 0-No pain   Pain Goal: Patients Stated Pain Goal: 2 (04/04/17 0449)               Maciah Feeback L

## 2017-04-04 NOTE — Plan of Care (Signed)
Problem: Activity: Goal: Ability to tolerate increased activity will improve Outcome: Progressing Patient does not have complete use of legs. Stedy used to assist patient to the bathroom. Instructions given for patient to call for assistance to bathroom.

## 2017-04-04 NOTE — Progress Notes (Signed)
Labor Progress Note  Stepheni Cameron is a 36 y.o. G3P2002 at [redacted]w[redacted]d  admitted for active labor  S: Doing well. Comfortable with epidural.   O:  BP 117/60   Pulse 64   Temp 99.1 F (37.3 C) (Oral)   Resp 18   Ht 5\' 4"  (1.626 m)   Wt 168 lb (76.2 kg)   LMP 07/11/2016 (Exact Date)   SpO2 100%   BMI 28.84 kg/m   No intake/output data recorded.  FHT:  FHR: 140 bpm, variability: moderate,  accelerations:  Present,  decelerations:  Absent UC:   regular, every 1-2 minutes SVE:   Dilation: 7.5 Effacement (%): 90 Station: -1 Exam by:: Rosana Hoes, RN  AROM @0925   Labs: Lab Results  Component Value Date   WBC 6.8 04/04/2017   HGB 12.2 04/04/2017   HCT 37.1 04/04/2017   MCV 81.7 04/04/2017   PLT 243 04/04/2017    Assessment / Plan: 36 y.o. G3P2002 [redacted]w[redacted]d active labor Augmentation of labor, progressing well  Labor: Progressing normally, Will augment with Pitocin, AROM performed Fetal Wellbeing:  Category I Pain Control:  Epidural Anticipated MOD:  NSVD  Expectant management  Luiz Blare, DO [facility] [place] 04/04/2017, 11:29 AM

## 2017-04-04 NOTE — Anesthesia Preprocedure Evaluation (Signed)
Anesthesia Evaluation  Patient identified by MRN, date of birth, ID band Patient awake    Reviewed: Allergy & Precautions, NPO status , Patient's Chart, lab work & pertinent test results  Airway Mallampati: II  TM Distance: >3 FB Neck ROM: Full    Dental  (+) Teeth Intact, Dental Advisory Given   Pulmonary neg pulmonary ROS,    Pulmonary exam normal breath sounds clear to auscultation       Cardiovascular negative cardio ROS Normal cardiovascular exam Rhythm:Regular Rate:Normal     Neuro/Psych negative neurological ROS     GI/Hepatic Neg liver ROS, GERD  Medicated,  Endo/Other  diabetes, Gestational, Oral Hypoglycemic Agents  Renal/GU negative Renal ROS     Musculoskeletal negative musculoskeletal ROS (+)   Abdominal   Peds  Hematology negative hematology ROS (+) Plt 243k   Anesthesia Other Findings Day of surgery medications reviewed with the patient.  Reproductive/Obstetrics (+) Pregnancy                             Anesthesia Physical Anesthesia Plan  ASA: II  Anesthesia Plan: Epidural   Post-op Pain Management:    Induction:   PONV Risk Score and Plan:   Airway Management Planned:   Additional Equipment:   Intra-op Plan:   Post-operative Plan:   Informed Consent: I have reviewed the patients History and Physical, chart, labs and discussed the procedure including the risks, benefits and alternatives for the proposed anesthesia with the patient or authorized representative who has indicated his/her understanding and acceptance.   Dental advisory given  Plan Discussed with:   Anesthesia Plan Comments: (Patient identified. Risks/Benefits/Options discussed with patient including but not limited to bleeding, infection, nerve damage, paralysis, failed block, incomplete pain control, headache, blood pressure changes, nausea, vomiting, reactions to medication both or allergic,  itching and postpartum back pain. Confirmed with bedside nurse the patient's most recent platelet count. Confirmed with patient that they are not currently taking any anticoagulation, have any bleeding history or any family history of bleeding disorders. Patient expressed understanding and wished to proceed. All questions were answered.   Spanish interpreter utilized throughout entire patient encounter.)        Anesthesia Quick Evaluation

## 2017-04-04 NOTE — MAU Note (Signed)
Pt states that contractions started yesterday at 1730. No vaginal bleeding or discharge. + FM

## 2017-04-04 NOTE — MAU Note (Signed)
Urine sent to lab 

## 2017-04-04 NOTE — Anesthesia Procedure Notes (Signed)
Epidural Patient location during procedure: OB Start time: 04/04/2017 5:04 AM End time: 04/04/2017 5:09 AM  Staffing Anesthesiologist: Catalina Gravel Performed: anesthesiologist   Preanesthetic Checklist Completed: patient identified, pre-op evaluation, timeout performed, IV checked, risks and benefits discussed and monitors and equipment checked  Epidural Patient position: sitting Prep: DuraPrep Patient monitoring: blood pressure and continuous pulse ox Approach: midline Location: L3-L4 Injection technique: LOR air  Needle:  Needle type: Tuohy  Needle gauge: 17 G Needle length: 9 cm Needle insertion depth: 4.5 cm Catheter size: 19 Gauge Catheter at skin depth: 9.5 cm Test dose: negative and Other (1% Lidocaine)  Additional Notes Patient identified.  Risk benefits discussed including failed block, incomplete pain control, headache, nerve damage, paralysis, blood pressure changes, nausea, vomiting, reactions to medication both toxic or allergic, and postpartum back pain.  Patient expressed understanding and wished to proceed.  All questions were answered.  Sterile technique used throughout procedure and epidural site dressed with sterile barrier dressing. No paresthesia or other complications noted. The patient did not experience any signs of intravascular injection such as tinnitus or metallic taste in mouth nor signs of intrathecal spread such as rapid motor block. Please see nursing notes for vital signs. Reason for block:procedure for pain

## 2017-04-04 NOTE — H&P (Signed)
LABOR ADMISSION HISTORY AND PHYSICAL  [name] Wu is a 36 y.o. female G3P2002 with IUP at [redacted]w[redacted]d by LMP presenting for SOL with A2GDM. She reports +FMs, No LOF, no VB, no blurry vision, no headaches, no peripheral edema, and no RUQ pain.  She plans on bottle feeding. She requests BTL for birth control. (papers signed 01/18/17)   Dating: By LMP --->  Estimated Date of Delivery: 04/17/17  Sono: [place] 69+4 wks,cephalic,fhr 503 bpm,normal ovaries bilat,BPP 8/8,post pl gr 3,afi 9.8 cm,efw 2968 g 54 %  Prenatal History/Complications:  Past Medical History: Past Medical History:  Diagnosis Date  . Gallbladder disease   . Gestational diabetes    glyburide  . Symptomatic cholelithiasis 01/30/2013    Past Surgical History: Past Surgical History:  Procedure Laterality Date  . CHOLECYSTECTOMY N/A 04/04/2013   Procedure: LAPAROSCOPIC CHOLECYSTECTOMY POSSIBLE IOC;  Surgeon: Harl Bowie, MD;  Location: [facility];  Service: General;  Laterality: N/A;    Obstetrical History: OB History    Gravida Para Term Preterm AB Living   3 2 2     2    SAB TAB Ectopic Multiple Live Births           2      Social History: Social History   Social History  . Marital status: Married    Spouse name: Jose  . Number of children: 2  . Years of education: 6   Occupational History  . homemaker    Social History Main Topics  . Smoking status: Never Smoker  . Smokeless tobacco: Never Used  . Alcohol use No  . Drug use: No  . Sexual activity: Yes    Partners: Male    Birth control/ protection: None   Other Topics Concern  . None   Social History Narrative   Lives with her husband and their 2 daughters. From [place]. Came to the [place] in 2010.        Family History: Family History  Problem Relation Age of Onset  . Hypertension Mother   . Diabetes Mother   . Cancer Father        pancreatic  . Hypertension Maternal Grandmother     Allergies: No Known  Allergies  Prescriptions Prior to Admission  Medication Sig Dispense Refill Last Dose  . glyBURIDE (DIABETA) 5 MG tablet Take 1 tablet at bedtime(10PM) 30 tablet 3 Taking  . omeprazole (PRILOSEC) 20 MG capsule 1 tablet twice daily 60 capsule 6 Taking  . Prenatal Vit-Fe Fumarate-FA (PRENATAL VITAMIN PO) Take by mouth daily. With Folic Acid   Taking     Review of Systems   All systems reviewed and negative except as stated in HPI  Blood pressure (!) 119/54, pulse 64, temperature 99.1 F (37.3 C), temperature source Oral, resp. rate 18, height 5\' 4"  (1.626 m), weight 76.2 kg (168 lb), last menstrual period 07/11/2016. General appearance: alert, cooperative and no distress Lungs: normal work of breathing Extremities: Homans sign is negative, no sign of DVT Presentation: cephalic Fetal monitoringBaseline: 145 bpm, Variability: Good {> 6 bpm), Accelerations: Reactive and Decelerations: Absent Uterine activity: Frequency: q 3-8 minutes Dilation: 5.5 Effacement (%): 80 Station: -2 Exam by:: Wende Bushy RN    Prenatal labs: ABO, Rh: --/--/O POS (07/30 0355) Antibody: PENDING (07/30 0355) Rubella: Immune RPR: Non Reactive (05/15 0837)  HBsAg: Negative (02/27 1448)  HIV:  Non-reactive GBS:   neg GTT: Fasting- 99, 1 hr- 203, 2 hr- 157  Prenatal Transfer Tool  Maternal  Diabetes: Yes:  Diabetes Type:  Insulin/Medication controlled Genetic Screening: Normal Maternal Ultrasounds/Referrals: Normal Fetal Ultrasounds or other Referrals:  None Maternal Substance Abuse:  No Significant Maternal Medications:  Meds include: Other: Glyburide 5mg  qday Significant Maternal Lab Results: Lab values include: Group B Strep negative  Results for orders placed or performed during the hospital encounter of 04/04/17 (from the past 24 hour(s))  CBC   Collection Time: 04/04/17  3:55 AM  Result Value Ref Range   WBC 6.8 4.0 - 10.5 K/uL   RBC 4.54 3.87 - 5.11 MIL/uL   Hemoglobin 12.2 12.0 - 15.0 g/dL    HCT 37.1 36.0 - 46.0 %   MCV 81.7 78.0 - 100.0 fL   MCH 26.9 26.0 - 34.0 pg   MCHC 32.9 30.0 - 36.0 g/dL   RDW 13.8 11.5 - 15.5 %   Platelets 243 150 - 400 K/uL  Type and screen [facility]   Collection Time: 04/04/17  3:55 AM  Result Value Ref Range   ABO/RH(D) O POS    Antibody Screen PENDING    Sample Expiration 04/07/2017   Glucose, capillary   Collection Time: 04/04/17  4:55 AM  Result Value Ref Range   Glucose-Capillary 66 65 - 99 mg/dL    Patient Active Problem List   Diagnosis Date Noted  . Normal labor 04/04/2017  . Oral hypoglycemic controlled White classification A2 gestational diabetes mellitus (GDM) 02/28/2017  . Bronchitis, acute 02/28/2017  . Supervision of normal pregnancy 11/02/2016    Assessment: [name] Wu is a 36 y.o. G3P2002 at [redacted]w[redacted]d here for SOL with A2GDM  #Labor: Expectant managment #Pain: Epidural upon request  #FWB: Cat 1 #ID: GBS neg #MOF: Breast and bottle #MOC:BTL (01/18/2017) #Circ: Undecided #A2GDM: CBC now and q4hours  [name] Shirley, DO Family Medicine Resident PGY-1  04/04/2017, 5:02 AM   OB FELLOW HISTORY AND PHYSICAL ATTESTATION  I have seen and examined this patient; I agree with above documentation in the resident's note.    [name] Shelter, MD OB Fellow 04/04/2017, 5:31 AM

## 2017-04-05 ENCOUNTER — Inpatient Hospital Stay (HOSPITAL_COMMUNITY): Payer: Medicaid Other | Admitting: Anesthesiology

## 2017-04-05 ENCOUNTER — Encounter (HOSPITAL_COMMUNITY)
Admission: AD | Disposition: A | Discharge status: Home / Self Care | Payer: Self-pay | Source: Home / Self Care | Attending: Obstetrics and Gynecology

## 2017-04-05 DIAGNOSIS — Z302 Encounter for sterilization: Secondary | ICD-10-CM

## 2017-04-05 HISTORY — PX: TUBAL LIGATION: SHX77

## 2017-04-05 LAB — GLUCOSE, CAPILLARY: GLUCOSE-CAPILLARY: 78 mg/dL (ref 65–99)

## 2017-04-05 SURGERY — LIGATION, FALLOPIAN TUBE, POSTPARTUM
Anesthesia: Epidural | Laterality: Bilateral

## 2017-04-05 MED ORDER — BUPIVACAINE HCL (PF) 0.25 % IJ SOLN
INTRAMUSCULAR | Status: AC
  Filled 2017-04-05: qty 30

## 2017-04-05 MED ORDER — PROPOFOL 10 MG/ML IV BOLUS
INTRAVENOUS | Status: DC | PRN
  Administered 2017-04-05: 200 mg via INTRAVENOUS

## 2017-04-05 MED ORDER — FENTANYL CITRATE (PF) 100 MCG/2ML IJ SOLN
INTRAMUSCULAR | Status: AC
  Filled 2017-04-05: qty 2

## 2017-04-05 MED ORDER — LIDOCAINE-EPINEPHRINE (PF) 2 %-1:200000 IJ SOLN
INTRAMUSCULAR | Status: AC
  Filled 2017-04-05: qty 20

## 2017-04-05 MED ORDER — FAMOTIDINE 20 MG PO TABS
40.0000 mg | ORAL_TABLET | Freq: Once | ORAL | Status: AC
  Administered 2017-04-05: 40 mg via ORAL
  Filled 2017-04-05: qty 2

## 2017-04-05 MED ORDER — LIDOCAINE HCL (CARDIAC) 20 MG/ML IV SOLN
INTRAVENOUS | Status: DC | PRN
  Administered 2017-04-05: 50 mg via INTRAVENOUS

## 2017-04-05 MED ORDER — KETOROLAC TROMETHAMINE 30 MG/ML IJ SOLN
INTRAMUSCULAR | Status: AC
  Filled 2017-04-05: qty 1

## 2017-04-05 MED ORDER — SODIUM BICARBONATE 8.4 % IV SOLN
INTRAVENOUS | Status: DC | PRN
  Administered 2017-04-05: 3 mL via EPIDURAL
  Administered 2017-04-05: 5 mL via EPIDURAL
  Administered 2017-04-05: 2 mL via EPIDURAL

## 2017-04-05 MED ORDER — SUGAMMADEX SODIUM 200 MG/2ML IV SOLN
INTRAVENOUS | Status: AC
  Filled 2017-04-05: qty 2

## 2017-04-05 MED ORDER — METOCLOPRAMIDE HCL 10 MG PO TABS
10.0000 mg | ORAL_TABLET | Freq: Once | ORAL | Status: AC
  Administered 2017-04-05: 10 mg via ORAL
  Filled 2017-04-05: qty 1

## 2017-04-05 MED ORDER — KETOROLAC TROMETHAMINE 30 MG/ML IJ SOLN
INTRAMUSCULAR | Status: DC | PRN
  Administered 2017-04-05: 30 mg via INTRAVENOUS

## 2017-04-05 MED ORDER — MIDAZOLAM HCL 2 MG/2ML IJ SOLN
INTRAMUSCULAR | Status: AC
  Filled 2017-04-05: qty 2

## 2017-04-05 MED ORDER — PROPOFOL 10 MG/ML IV BOLUS
INTRAVENOUS | Status: AC
  Filled 2017-04-05: qty 20

## 2017-04-05 MED ORDER — MIDAZOLAM HCL 2 MG/2ML IJ SOLN
INTRAMUSCULAR | Status: DC | PRN
  Administered 2017-04-05: 1 mg via INTRAVENOUS

## 2017-04-05 MED ORDER — ROCURONIUM BROMIDE 100 MG/10ML IV SOLN
INTRAVENOUS | Status: AC
  Filled 2017-04-05: qty 1

## 2017-04-05 MED ORDER — SUCCINYLCHOLINE CHLORIDE 200 MG/10ML IV SOSY
PREFILLED_SYRINGE | INTRAVENOUS | Status: AC
  Filled 2017-04-05: qty 10

## 2017-04-05 MED ORDER — SUCCINYLCHOLINE CHLORIDE 20 MG/ML IJ SOLN
INTRAMUSCULAR | Status: DC | PRN
  Administered 2017-04-05: 140 mg via INTRAVENOUS

## 2017-04-05 MED ORDER — FENTANYL CITRATE (PF) 100 MCG/2ML IJ SOLN
25.0000 ug | INTRAMUSCULAR | Status: DC | PRN
Start: 2017-04-05 — End: 2017-04-05
  Administered 2017-04-05 (×3): 50 ug via INTRAVENOUS

## 2017-04-05 MED ORDER — FENTANYL CITRATE (PF) 100 MCG/2ML IJ SOLN
INTRAMUSCULAR | Status: DC | PRN
  Administered 2017-04-05: 100 ug via INTRAVENOUS

## 2017-04-05 MED ORDER — LACTATED RINGERS IV SOLN
INTRAVENOUS | Status: DC
  Administered 2017-04-05: 20 mL/h via INTRAVENOUS
  Administered 2017-04-05 (×2): via INTRAVENOUS

## 2017-04-05 MED ORDER — FENTANYL CITRATE (PF) 100 MCG/2ML IJ SOLN
INTRAMUSCULAR | Status: AC
  Administered 2017-04-05: 50 ug via INTRAVENOUS
  Filled 2017-04-05: qty 2

## 2017-04-05 MED ORDER — LIDOCAINE HCL (CARDIAC) 20 MG/ML IV SOLN
INTRAVENOUS | Status: AC
  Filled 2017-04-05: qty 5

## 2017-04-05 MED ORDER — SODIUM BICARBONATE 8.4 % IV SOLN
INTRAVENOUS | Status: AC
  Filled 2017-04-05: qty 50

## 2017-04-05 MED ORDER — FENTANYL CITRATE (PF) 250 MCG/5ML IJ SOLN
INTRAMUSCULAR | Status: AC
  Filled 2017-04-05: qty 5

## 2017-04-05 MED ORDER — DEXAMETHASONE SODIUM PHOSPHATE 10 MG/ML IJ SOLN
INTRAMUSCULAR | Status: DC | PRN
  Administered 2017-04-05: 8 mg via INTRAVENOUS

## 2017-04-05 MED ORDER — BUPIVACAINE HCL (PF) 0.25 % IJ SOLN
INTRAMUSCULAR | Status: DC | PRN
  Administered 2017-04-05: 30 mL

## 2017-04-05 SURGICAL SUPPLY — 21 items
BLADE SURG 11 STRL SS (BLADE) ×3 IMPLANT
CLIP FILSHIE TUBAL LIGA STRL (Clip) ×3 IMPLANT
CLOTH BEACON ORANGE TIMEOUT ST (SAFETY) ×3 IMPLANT
DRSG OPSITE POSTOP 3X4 (GAUZE/BANDAGES/DRESSINGS) ×3 IMPLANT
DURAPREP 26ML APPLICATOR (WOUND CARE) ×3 IMPLANT
GLOVE BIOGEL PI IND STRL 7.0 (GLOVE) ×1 IMPLANT
GLOVE BIOGEL PI IND STRL 7.5 (GLOVE) ×1 IMPLANT
GLOVE BIOGEL PI INDICATOR 7.0 (GLOVE) ×2
GLOVE BIOGEL PI INDICATOR 7.5 (GLOVE) ×2
GLOVE ECLIPSE 7.5 STRL STRAW (GLOVE) ×3 IMPLANT
GOWN STRL REUS W/TWL LRG LVL3 (GOWN DISPOSABLE) ×6 IMPLANT
NEEDLE HYPO 22GX1.5 SAFETY (NEEDLE) ×3 IMPLANT
NS IRRIG 1000ML POUR BTL (IV SOLUTION) ×3 IMPLANT
PACK ABDOMINAL MINOR (CUSTOM PROCEDURE TRAY) ×3 IMPLANT
PROTECTOR NERVE ULNAR (MISCELLANEOUS) ×3 IMPLANT
SPONGE LAP 4X18 X RAY DECT (DISPOSABLE) ×3 IMPLANT
SUT VICRYL 0 UR6 27IN ABS (SUTURE) ×3 IMPLANT
SUT VICRYL 4-0 PS2 18IN ABS (SUTURE) ×3 IMPLANT
SYR CONTROL 10ML LL (SYRINGE) ×3 IMPLANT
TOWEL OR 17X24 6PK STRL BLUE (TOWEL DISPOSABLE) ×6 IMPLANT
TRAY FOLEY CATH SILVER 14FR (SET/KITS/TRAYS/PACK) ×3 IMPLANT

## 2017-04-05 NOTE — Plan of Care (Signed)
Problem: Role Relationship: Goal: Ability to demonstrate positive interaction with newborn will improve Outcome: 40 Mom is bonding well with baby.

## 2017-04-05 NOTE — Lactation Note (Signed)
This note was copied from a baby's chart. Lactation Consultation Note P1 mom with BF exp. 1.5 yrs with 37 year old, 5 months with 36 year old.  Interpreter used on telephone as house interpreter was unavailable.  Mom back from tubal ligation and feeling nauseated but states bf is going well.  She does feel like right now she has little milk.  LC offers to help and teach her hand expression; mom is agreeable and colostrum is seen easily upon demonstration.  Illustrations shown in book of hand expression and bf basics reviewed with mom and dad.  Dad held infant in blankets while mom rested awaiting anti nausea medicine.  Mom encouraged to do STS, watch for feeding cues, and feed at least 8-12 times in a 24 hour period.    Mom and dad were encouraged to call out for further questions, concerns or assistance with BF.   BF brochure and resource sheet shared with family.    Patient Name: [name] Wu MGNOI'B Date: 04/05/2017     Maternal Data Formula Feeding for Exclusion: No Has patient been taught Hand Expression?: Yes (LC demonstrated for mom/mom feeling nauseated did not return demo) Does the patient have breastfeeding experience prior to this delivery?: Yes  Feeding Feeding Type: Formula Nipple Type: Slow - flow  LATCH Score                   Interventions    Lactation Tools Discussed/Used WIC Program: Yes   Consult Status Consult Status: Follow-up Date: 04/06/17 Follow-up type: In-patient    Ferne Coe [facility] 04/05/2017, 4:53 PM

## 2017-04-05 NOTE — Plan of Care (Signed)
Problem: Pain Management: Goal: General experience of comfort will improve and pain level will decrease Outcome: Progressing Mom verbalizes no pain at this time. Will continue to monitor.

## 2017-04-05 NOTE — Anesthesia Procedure Notes (Signed)
Procedure Name: Intubation Date/Time: 04/05/2017 2:02 PM Performed by: Bufford Spikes Pre-anesthesia Checklist: Patient identified, Emergency Drugs available, Suction available and Patient being monitored Patient Re-evaluated:Patient Re-evaluated prior to induction Oxygen Delivery Method: Circle system utilized Preoxygenation: Pre-oxygenation with 100% oxygen Induction Type: IV induction Ventilation: Mask ventilation without difficulty Laryngoscope Size: Mac, Sabra Heck and 2 Grade View: Grade II Tube type: Oral Tube size: 7.0 mm Number of attempts: 1 Airway Equipment and Method: Stylet Placement Confirmation: ETT inserted through vocal cords under direct vision,  positive ETCO2 and breath sounds checked- equal and bilateral Secured at: 21 cm Tube secured with: Tape Dental Injury: Teeth and Oropharynx as per pre-operative assessment

## 2017-04-05 NOTE — Progress Notes (Signed)
Patient ID: [name] Wu, female   DOB: Oct 20, 1980, 36 y.o.   MRN: [id number]  Risks of procedure discussed with patient including but not limited to: risk of regret, permanence of method, bleeding, infection, injury to surrounding organs and need for additional procedures.  Failure risk of 1 -2 % with increased risk of ectopic gestation if pregnancy occurs was also discussed with patient.    Truett Mainland, DO 04/05/2017 9:43 AM

## 2017-04-05 NOTE — Transfer of Care (Signed)
Immediate Anesthesia Transfer of Care Note  Patient: [name] Wu  Procedure(s) Performed: Procedure(s): POST PARTUM TUBAL LIGATION (Bilateral)  Patient Location: PACU  Anesthesia Type:General  Level of Consciousness: awake, alert  and oriented  Airway & Oxygen Therapy: Patient Spontanous Breathing and Patient connected to nasal cannula oxygen  Post-op Assessment: Report given to RN and Post -op Vital signs reviewed and stable  Post vital signs: Reviewed and stable  Last Vitals:  Vitals:   04/05/17 1210 04/05/17 1329  BP: 100/62 (!) 100/48  Pulse: 62 63  Resp: 16 18  Temp: 36.7 C 36.8 C    Last Pain:  Vitals:   04/05/17 1329  TempSrc: Oral  PainSc:       Patients Stated Pain Goal: 2 (84/69/62 9528)  Complications: No apparent anesthesia complications

## 2017-04-05 NOTE — Progress Notes (Signed)
Post Partum Day 1 Subjective: no complaints, up ad lib, voiding, tolerating PO, + flatus and Patient to have BTL today if she can be fit into the OR schedule and would like to go home after.   Objective: Blood pressure (!) 111/50, pulse (!) 57, temperature 97.6 F (36.4 C), temperature source Oral, resp. rate 18, height 5\' 4"  (1.626 m), weight 76.2 kg (168 lb), last menstrual period 07/11/2016, SpO2 100 %, unknown if currently breastfeeding.  Physical Exam:  General: alert, cooperative and no distress Lochia: appropriate Uterine Fundus: firm Incision: n/a DVT Evaluation: No evidence of DVT seen on physical exam. Negative Homan's sign. No significant calf/ankle edema.   Recent Labs  04/04/17 0355  HGB 12.2  HCT 37.1    Assessment/Plan: Discharge home pending BTL, breast and bottle feeding.   LOS: 1 day   [place] Shirley 04/05/2017, 8:32 AM   CNM attestation Post Partum Day #1 I have seen and examined this patient and agree with above documentation in the resident's note.   Leonilda Cozby is a 36 y.o. [phone] s/p SVD.  Pt denies problems with ambulating, voiding or po intake. Pain is well controlled.  Plan for birth control is bilateral tubal ligation for later this day.  Method of Feeding: both  PE:  BP (!) 106/48 (BP Location: Right Arm)   Pulse (!) 58   Temp 98.1 F (36.7 C) (Oral)   Resp 18   Ht 5\' 4"  (1.626 m)   Wt 76.2 kg (168 lb)   LMP 07/11/2016 (Exact Date)   SpO2 98%   Breastfeeding? Unknown   BMI 28.84 kg/m  Fundus firm  Plan for discharge: either after BTL, or the next day  Durelle Zepeda, CNM 12:04 AM

## 2017-04-05 NOTE — Anesthesia Preprocedure Evaluation (Signed)
Anesthesia Evaluation  Patient identified by MRN, date of birth, ID band Patient awake    Reviewed: Allergy & Precautions, NPO status , Patient's Chart, lab work & pertinent test results  Airway Mallampati: II  TM Distance: >3 FB Neck ROM: Full    Dental  (+) Teeth Intact, Dental Advisory Given   Pulmonary neg pulmonary ROS,    Pulmonary exam normal breath sounds clear to auscultation       Cardiovascular negative cardio ROS Normal cardiovascular exam Rhythm:Regular Rate:Normal     Neuro/Psych negative neurological ROS     GI/Hepatic Neg liver ROS, GERD  Medicated,  Endo/Other  diabetes, Gestational, Oral Hypoglycemic Agents  Renal/GU negative Renal ROS     Musculoskeletal negative musculoskeletal ROS (+)   Abdominal   Peds  Hematology negative hematology ROS (+) Plt 243k   Anesthesia Other Findings Day of surgery medications reviewed with the patient.  Reproductive/Obstetrics (+) Pregnancy                             Anesthesia Physical  Anesthesia Plan  ASA: II  Anesthesia Plan: Epidural   Post-op Pain Management:    Induction:   PONV Risk Score and Plan:   Airway Management Planned:   Additional Equipment:   Intra-op Plan:   Post-operative Plan:   Informed Consent: I have reviewed the patients History and Physical, chart, labs and discussed the procedure including the risks, benefits and alternatives for the proposed anesthesia with the patient or authorized representative who has indicated his/her understanding and acceptance.   Dental Advisory Given  Plan Discussed with:   Anesthesia Plan Comments: (Labs checked- platelets confirmed with RN in room. Fetal heart tracing, per RN, reported to be stable enough for sitting procedure. Discussed epidural, and patient consents to the procedure:  included risk of possible headache,backache, failed block, allergic reaction,  and nerve injury. This patient was asked if she had any questions or concerns before the procedure started.)        Anesthesia Quick Evaluation

## 2017-04-05 NOTE — Progress Notes (Signed)
Could not order meals for the patient because she is NPO. [place] Interpreter.

## 2017-04-05 NOTE — Lactation Note (Signed)
This note was copied from a baby's chart. Lactation Consultation Note  When LC came to see pt., RN informed LC that the pt. Was having a tubal ligation scheduled at 1:30.  LC will come back with interpreter/ interpretting tool later today.  Patient Name: [name] Wu DPTEL'M Date: 04/05/2017     Maternal Data    Feeding Feeding Type: Formula  LATCH Score                   Interventions    Lactation Tools Discussed/Used     Consult Status      Ferne Coe [facility] 04/05/2017, 2:06 PM

## 2017-04-05 NOTE — Op Note (Addendum)
[name] Wu 04/04/2017 - 04/05/2017  PREOPERATIVE DIAGNOSIS:  Undesired fertility  POSTOPERATIVE DIAGNOSIS:  Undesired fertility  PROCEDURE:  Postpartum Bilateral Tubal Sterilization using Filshie Clips   SURGEON:  Dr Loma Boston  ANESTHESIA:  Epidural  COMPLICATIONS:  None immediate.  ESTIMATED BLOOD LOSS:  Less than 20cc.  FLUIDS: 1000 mL LR.  URINE OUTPUT:  250 mL of clear urine.  INDICATIONS: 36 y.o. yo [id number]  with undesired fertility,status post vaginal delivery, desires permanent sterilization. Risks and benefits of procedure discussed with patient including permanence of method, bleeding, infection, injury to surrounding organs and need for additional procedures. Risk failure of 0.5-1% with increased risk of ectopic gestation if pregnancy occurs was also discussed with patient.   FINDINGS:  Normal uterus, tubes, and ovaries.  TECHNIQUE:  The patient was taken to the operating room where her epidural anesthesia was dosed up to surgical level and found to be adequate.  She was then placed in the dorsal supine position and prepped and draped in sterile fashion.  After an adequate timeout was performed, attention was turned to the patient's abdomen where a small transverse skin incision was made in the umbilical fold. The incision was taken down to the layer of fascia using the scalpel, and fascia was incised, and extended bilaterally using Mayo scissors. The peritoneum was entered in a sharp fashion. Attention was then turned to the patient's uterus, and left fallopian tube was identified and followed out to the fimbriated end.  A Filshie clip was placed on the left fallopian tube about 2 cm from the cornual attachment, with care given to incorporate the underlying mesosalpinx.  A similar process was carried out on the rightl side allowing for bilateral tubal sterilization.  Good hemostasis was noted overall.  Local analgesia was drizzled on both operative sites.The  instruments were then removed from the patient's abdomen and the fascial incision was repaired with 0 Vicryl, and the skin was closed with a 3-0 Monocryl subcuticular stitch. The patient tolerated the procedure well.  Sponge, lap, and needle counts were correct times two.  The patient was then taken to the recovery room awake, extubated and in stable condition.   Truett Mainland, DO 04/05/2017 2:31 PM

## 2017-04-06 ENCOUNTER — Encounter (HOSPITAL_COMMUNITY): Payer: Self-pay

## 2017-04-06 ENCOUNTER — Encounter (HOSPITAL_COMMUNITY): Payer: Self-pay | Admitting: Family Medicine

## 2017-04-06 MED ORDER — OXYCODONE-ACETAMINOPHEN 5-325 MG PO TABS: 1.0000 | ORAL_TABLET | Freq: Four times a day (QID) | ORAL | 0 refills | Status: AC | PRN

## 2017-04-06 MED ORDER — IBUPROFEN 600 MG PO TABS: 600.0000 mg | ORAL_TABLET | Freq: Four times a day (QID) | ORAL | 0 refills | Status: DC | PRN

## 2017-04-06 NOTE — Discharge Instructions (Signed)
Ligadura laparoscpica de trompas, cuidados posteriores (Laparoscopic Tubal Ligation, Care After) Siga estas instrucciones durante las prximas semanas. Estas indicaciones le proporcionan informacin acerca de cmo deber cuidarse despus del procedimiento. El mdico tambin podr darle instrucciones ms especficas. El tratamiento ha sido planificado segn las prcticas mdicas actuales, pero en algunos casos pueden ocurrir problemas. Comunquese con el mdico si tiene algn problema o dudas despus del procedimiento. QU ESPERAR DESPUS DEL PROCEDIMIENTO Despus del procedimiento, es comn [employer] siguientes sntomas: Dolor de Investment banker, operational. Molestias en el hombro. Calambres o molestias leves en el abdomen. Dolor por gases. Dolor o molestias en la zona donde se realiz el corte quirrgico (incisin). Sensacin de hinchazn. Cansancio. Nuseas. Vmitos. [place] los medicamentos de venta libre y los recetados solamente como se lo haya indicado el mdico. No tome aspirina, ya que puede causar hemorragias. No conduzca ni opere maquinaria pesada mientras toma analgsicos recetados. Actividades Haga reposo durante el resto del da. Reanude sus actividades normales como se lo haya indicado el mdico. Pregntele al mdico qu actividades son seguras para usted. Cuidado de la incisin Siga las indicaciones del mdico acerca del cuidado de la incisin. Haga lo siguiente: [employer] con agua y jabn antes de Quarry manager las vendas (vendaje). Use desinfectante para manos si no dispone de [place] y [place]. Cambie el vendaje como se lo haya indicado el mdico. No retire los puntos (suturas). Tal vez deban dejarse puestos en la piel durante 2semanas o ms tiempo. [place] zona de la incisin para detectar signos de infeccin. Est atento a los siguientes signos: Aumento del enrojecimiento, la hinchazn o Conservation officer, historic buildings. Ms lquido Delorise Shiner. Calor. Pus o mal olor. Otras indicaciones No tome baos de inmersin, no nade ni use el jacuzzi hasta que el mdico lo autorice. Puede ducharse. Concurra a todas las visitas de control como se lo haya indicado el mdico. Esto es importante. Pida a alguien Actor con las tareas [employer] diarias durante los [employer]. SOLICITE ATENCIN MDICA SI: Aumentan el enrojecimiento, la hinchazn o el dolor alrededor de la incisin. La incisin est caliente al tacto. Tiene pus o percibe que sale mal olor del lugar de la incisin. Se le abren los bordes de la incisin despus de que le hayan sacado las suturas. El dolor no mejora despus de 2a 3das. Tiene una erupcin cutnea. Tiene mareos o sensacin de desvanecimiento frecuentes. El medicamento no Production designer, theatre/television/film. Tiene estreimiento. SOLICITE ATENCIN MDICA DE INMEDIATO SI: Tiene fiebre. Se desmaya. Siente ms dolor en el abdomen. Tiene un dolor intenso en uno o ambos hombros. Observa ms lquido o sangre que sale de las suturas o de la vagina. Le falta el aire o tiene dificultad para respirar. Siente dolor en el pecho o en las piernas. Tiene nuseas, vmitos o diarrea de forma continua. Esta informacin no tiene Marine scientist el consejo del mdico. Asegrese de hacerle al mdico cualquier pregunta que tenga. Document Released: 02/09/2008 Document Revised: 12/15/2015 Document Reviewed: 08/03/2015 Elsevier Interactive Patient Education  2018 [place] para la mam sobre los cuidados en el hogar (Home Care Instructions for Mom) ACTIVIDAD  Reanude sus actividades regulares de forma gradual.  Descanse. Tome siestas cuando el beb duerme.  No levante objetos que pesen ms de 10libras (4,5kg) hasta que el mdico se lo autorice.  Evite las actividades que demandan mucho esfuerzo y Teacher, early years/pre (que son extenuantes) hasta que el mdico se lo  autorice. Caminar a un ritmo tranquilo a moderado  siempre es ms seguro.  Si tuvo un parto por cesrea: ? No pase la aspiradora, suba escaleras o conduzca un vehculo durante 4 o 6 semanas. ? Pdale a alguien que le brinde ayuda con las tareas [employer] que pueda realizarlas por su cuenta. ? Haga ejercicios como se lo haya indicado el mdico, si corresponde.  HEMORRAGIA VAGINAL  Probablemente contine sangrando durante 4 o 6 semanas despus del parto. Generalmente, la cantidad de sangre disminuye y el color se hace ms claro con el transcurso del [place]. Sin embargo, si usted est [employer], el color de la sangre puede ser rojo brillante. Si necesita cambiarse la compresa higinica en menos de una hora o tiene cogulos grandes:  Permanezca acostada.  Eleve los pies.  Coloque compresas fras en la zona inferior del abdomen.  Haga reposo.  Comunquese con su mdico. Si est amamantando, podra volver a tener su perodo [employer] 8 semanas despus del parto y el momento en que deje de Economist. Si no est amamantando, volver a tener su perodo 6 u 8 semanas despus del [place]. CUIDADOS PERINEALES La zona perineal o perineo, es la parte del cuerpo que se encuentra entre los muslos. Despus del parto, esta zona necesita un cuidado especial. [place] siguientes indicaciones como se lo haya indicado su mdico.  Tome baos de inmersin durante 15 o 20 minutos.  Utilice apsitos o [employer] y Nature conservation officer se lo hayan indicado.  No utilice tampones ni se haga duchas vaginales hasta que el sangrado vaginal se haya detenido.  Cada vez que vaya al bao: ? Use una botella perineal. ? Cmbiese el apsito. ? Use papel tis en lugar de papel higinico hasta que se cure la sutura.  Haga ejercicios de [employer]. Los ejercicios Kegel ayudan a Theatre manager los msculos que sostienen la vagina, la vejiga y los intestinos. Estos ejercicios se pueden Regulatory affairs officer est parada, sentada o acostada. Para hacer los  ejercicios de Kegel: ? Tense los msculos del estmago y los que rodean el canal de [place]. ? Mantenga esta posicin durante unos segundos. ? Reljese. ? Repita hasta hacerlos 5 veces seguidas.  Para evitar las hemorroides o que estas empeoren: ? Beba suficiente lquido para mantener la orina clara o de color amarillo plido. ? Evite hacer fuerza al defecar. ? Tome los [employer] y laxantes de venta libre como se lo haya indicado el mdico. CUIDADO DE LAS MAMAS  Use un buen sostn.  Evite tomar analgsicos de venta libre para las [employer].  Aplique hielo en los pechos para aliviar las molestias tanto como sea necesario: ? Field seismologist hielo en una bolsa plstica. ? Coloque una [employer] piel y la bolsa de hielo. ? Aplique el hielo durante 20, o como se lo haya indicado el mdico.  NUTRICIN  Mantenga una dieta bien balanceada.  No intente perder de peso rpidamente reduciendo el consumo de caloras.  Tome sus vitaminas prenatales hasta el control de postparto o hasta que su mdico se lo indique.  DEPRESIN POSTPARTO  Puede sentir deseos de llorar sin motivo aparente y verse incapaz de enfrentarse a todos los cambios que implica tener un beb. Este estado de nimo se llama depresin postparto. La depresin postparto ocurre porque sus niveles hormonales sufren cambios despus del [place]. Si usted tiene depresin postparto, busque contencin por parte de su pareja, sus amigos y su familia. Si la depresin no desaparece  por s sola despus de algunas semanas, concurra a su mdico. AUTOEXAMEN DE MAMAS Realcese autoexmenes en el mismo momento cada mes. Si est amamantando, el mejor momento de controlar sus mamas es despus de Research scientist (life sciences) al beb, cuando los pechos no estn tan llenos. Si est amamantando y su perodo ya comenz, controle sus mamas el da 5, 6 o 7 de su perodo. Informe a su mdico de cualquier protuberancia, bulto o secrecin. Si est amamantando, las  [employer]. Esto es transitorio y no es un riesgo para Technical sales engineer. INTIMIDAD Y SEXUALIDAD Debe evitar las relaciones sexuales durante al menos 3 o 4 semanas despus del parto o hasta que el flujo de color rojo amarronado haya desaparecido completamente. Si no desea quedar embarazada nuevamente, use algn mtodo anticonceptivo. Despus del parto, puede quedar embarazada incluso si no ha tenido todava el perodo. SOLICITE ATENCIN MDICA SI:  Se siente incapaz de controlar los cambios que implica tener un hijo y esos sentimientos no desaparecen despus de Corporate treasurer.  Detecta una protuberancia, bulto o secrecin en sus mamas.  SOLICITE ATENCIN MDICA DE INMEDIATO SI:  Debe cambiarse la compresa higinica en 1 hora o menos.  Tiene los siguientes sntomas: ? Dolor intenso o calambres en la parte inferior del abdomen. ? Una secrecin vaginal con mal olor. ? Fiebre que no se [employer]. ? Una zona de la mama se pone roja y Associate Professor, y adems usted tiene fiebre. ? Una pantorrilla enrojecida y con dolor. ? Repentino e intenso dolor en el pecho. ? Falta de aire. ? Miccin dolorosa o con sangre. ? Problemas visuales.  Vmitos durante 12 horas o ms.  Dolor de cabeza intenso.  Tiene pensamientos serios acerca de lastimarse a usted misma o daar al nio o a otra persona.  Esta informacin no tiene Marine scientist el consejo del mdico. Asegrese de hacerle al mdico cualquier pregunta que tenga. Document Released: 08/23/2005 Document Revised: 12/15/2015 Document Reviewed: 02/24/2015 Elsevier Interactive Patient Education  2017 [employer].

## 2017-04-06 NOTE — Lactation Note (Signed)
This note was copied from a baby's chart. Lactation Consultation Note  Patient Name: [name] Wu [id number] Date: 04/06/2017   Spanish interpreter used # [phone]. P3, Denies concerns or questions. Mom encouraged to feed baby 8-12 times/24 hours and with feeding cues.  Reviewed engorgement care and monitoring voids/stools. Encouraged breastfeeding before offering formula to help establish her milk supply.      Maternal Data    Feeding    LATCH Score                   Interventions    Lactation Tools Discussed/Used     Consult Status      Carlye Grippe 04/06/2017, 10:18 AM

## 2017-04-06 NOTE — Discharge Summary (Signed)
OB Discharge Summary     Patient Name: [name] Wu DOB: 11/20/1980 MRN: [id number]  Date of admission: 04/04/2017 Delivering MD: Katheren Shams   Date of discharge: 04/06/2017  Admitting diagnosis: 70 WEEKS CTX desires sterilization Intrauterine pregnancy: [redacted]w[redacted]d     Secondary diagnosis:  Active Problems:   Normal labor GDM A2  Additional problems: none     Discharge diagnosis: Term Pregnancy Delivered and GDM A2                                                                                                Post partum procedures:postpartum tubal ligation  Augmentation: AROM and Pitocin  Complications: None  Hospital course:  Onset of Labor With Vaginal Delivery     36 y.o. yo G3P3003 at [redacted]w[redacted]d was admitted in Latent Labor on 04/04/2017. Patient had an uncomplicated labor course as follows:  Membrane Rupture Time/Date: 9:25 AM ,04/04/2017   Intrapartum Procedures: Episiotomy: None [1]                                         Lacerations:  1st degree [2]  Patient had a delivery of a Viable infant. 04/04/2017  Information for the patient's newborn:  Allex, Madia [[id number]]  Delivery Method: Vaginal, Spontaneous Delivery (Filed from Delivery Summary)    Pateint had an uncomplicated postpartum course.  On PPD#1 she had a tubal ligation. She is ambulating, tolerating a regular diet, passing flatus, and urinating well. Patient is discharged home in stable condition on 04/06/17.   Physical exam  Vitals:   04/05/17 1703 04/05/17 2135 04/06/17 0100 04/06/17 0525  BP: 126/60 (!) 111/47 (!) 119/52 (!) 106/48  Pulse: 61 (!) 58 (!) 57 (!) 58  Resp: 16 18 18 18   Temp: 97.8 F (36.6 C) 98.2 F (36.8 C) 97.9 F (36.6 C) 98.1 F (36.7 C)  TempSrc: Oral Oral Oral Oral  SpO2: 97% 97% 98% 98%  Weight:      Height:       General: alert and cooperative Lochia: appropriate Uterine Fundus: firm Incision: BTL dsg dry, intact DVT Evaluation: No evidence of DVT  seen on physical exam. Labs: Lab Results  Component Value Date   WBC 6.8 04/04/2017   HGB 12.2 04/04/2017   HCT 37.1 04/04/2017   MCV 81.7 04/04/2017   PLT 243 04/04/2017   CMP Latest Ref Rng & Units 12/11/2013  Glucose 70 - 99 mg/dL 104(H)  BUN 6 - 23 mg/dL 10  Creatinine 0.50 - 1.10 mg/dL 0.72  Sodium 135 - 145 mEq/L 138  Potassium 3.5 - 5.3 mEq/L 4.2  Chloride 96 - 112 mEq/L 105  CO2 19 - 32 mEq/L 26  Calcium 8.4 - 10.5 mg/dL 8.9  Total Protein 6.0 - 8.3 g/dL 6.8  Total Bilirubin 0.2 - 1.2 mg/dL 0.8  Alkaline Phos 39 - 117 U/L 41  AST 0 - 37 U/L 10  ALT 0 - 35 U/L 8    Discharge instruction: per After Visit  Summary and "Baby and Me Booklet".  After visit meds:  Allergies as of 04/06/2017   No Known Allergies     Medication List    STOP taking these medications   glyBURIDE 5 MG tablet Commonly known as:  DIABETA   omeprazole 20 MG capsule Commonly known as:  PRILOSEC     TAKE these medications   ibuprofen 600 MG tablet Commonly known as:  ADVIL,MOTRIN Take 1 tablet (600 mg total) by mouth every 6 (six) hours as needed.   oxyCODONE-acetaminophen 5-325 MG tablet Commonly known as:  ROXICET Take 1 tablet by mouth every 6 (six) hours as needed.   PRENATAL/FOLIC ACID Tabs Take 1 tablet by mouth at bedtime.       Diet: routine diet  Activity: Advance as tolerated. Pelvic rest for 6 weeks.   Outpatient follow up:4 weeks Follow up Appt:No future appointments. Follow up Visit:No Follow-up on file.  Postpartum contraception: Tubal Ligation  Newborn Data: Live born female  Birth Weight: 6 lb 13 oz (3090 g) APGAR: 9, 9  Baby Feeding: Bottle and Breast Disposition:home with mother   04/06/2017 Serita Grammes, CNM  8:56 AM

## 2017-04-07 ENCOUNTER — Other Ambulatory Visit: Payer: Medicaid Other | Admitting: Obstetrics and Gynecology

## 2017-04-08 NOTE — Anesthesia Postprocedure Evaluation (Signed)
Anesthesia Post Note  Patient: [name] Wu  Procedure(s) Performed: Procedure(s) (LRB): POST PARTUM TUBAL LIGATION (Bilateral)     Patient location during evaluation: PACU Anesthesia Type: Epidural Level of consciousness: awake and alert Pain management: pain level controlled Vital Signs Assessment: post-procedure vital signs reviewed and stable Respiratory status: spontaneous breathing, nonlabored ventilation, respiratory function stable and patient connected to nasal cannula oxygen Cardiovascular status: blood pressure returned to baseline and stable Postop Assessment: no signs of nausea or vomiting Anesthetic complications: no    Last Vitals:  Vitals:   04/06/17 0100 04/06/17 0525  BP: (!) 119/52 (!) 106/48  Pulse: (!) 57 (!) 58  Resp: 18 18  Temp: 36.6 C 36.7 C    Last Pain:  Vitals:   04/06/17 0930  TempSrc:   PainSc: 0-No pain   Pain Goal: Patients Stated Pain Goal: 2 (04/05/17 0915)               Lyndle Herrlich EDWARD

## 2017-04-10 ENCOUNTER — Inpatient Hospital Stay (HOSPITAL_COMMUNITY): Admission: RE | Admit: 2017-04-10 | Payer: Medicaid Other | Source: Ambulatory Visit

## 2017-05-04 ENCOUNTER — Encounter: Payer: Self-pay | Admitting: Advanced Practice Midwife

## 2017-05-04 ENCOUNTER — Ambulatory Visit (INDEPENDENT_AMBULATORY_CARE_PROVIDER_SITE_OTHER): Payer: Medicaid Other | Admitting: Advanced Practice Midwife

## 2017-05-04 NOTE — Progress Notes (Signed)
[name] Wu is a 37 y.o. who presents for a postpartum visit. She is 4 weeks postpartum following a spontaneous vaginal delivery. I have fully reviewed the prenatal and intrapartum course. The delivery was at 38.1 gestational weeks.  Anesthesia: epidural. Postpartum course has been uneventdful. Baby's course has been uneventful. Baby is feeding by breast and bottle. Bleeding: staining only. Bowel function is normal. Bladder function is normal. Patient is not sexually active. Contraception method is tubal ligation.done in hospital Postpartum depression screening: negative.   Current Outpatient Prescriptions:  .  ibuprofen (ADVIL,MOTRIN) 600 MG tablet, Take 1 tablet (600 mg total) by mouth every 6 (six) hours as needed. (Patient not taking: Reported on 05/04/2017), Disp: 30 tablet, Rfl: 0 .  oxyCODONE-acetaminophen (ROXICET) 5-325 MG tablet, Take 1 tablet by mouth every 6 (six) hours as needed. (Patient not taking: Reported on 05/04/2017), Disp: 20 tablet, Rfl: 0 .  Prenatal Vit-Fe Fumarate-FA (PRENATAL/FOLIC ACID) TABS, Take 1 tablet by mouth at bedtime., Disp: , Rfl:   Review of Systems   Constitutional: Negative for fever and chills Eyes: Negative for visual disturbances Respiratory: Negative for shortness of breath, dyspnea Cardiovascular: Negative for chest pain or palpitations  Gastrointestinal: Negative for vomiting, diarrhea and constipation Genitourinary: Negative for dysuria and urgency Musculoskeletal: Negative for back pain, joint pain, myalgias  Neurological: Negative for dizziness and headaches    Objective:     Vitals:   05/04/17 1331  BP: 104/60  Pulse: 60   General:  alert, cooperative and no distress   Breasts:  negative  Lungs: clear to auscultation bilaterally  Heart:  regular rate and rhythm  Abdomen: Soft, nontender   Vulva:  normal  Vagina: normal vagina  Cervix:  closed  Corpus: Well involuted     Rectal Exam: no hemorrhoids        Assessment:     normal postpartum exam.  Plan:   1. Contraception: tubal ligation 2. Follow up in:  3 weeks at [facility] for 2hr GTT or as needed.

## 2017-05-30 ENCOUNTER — Other Ambulatory Visit: Payer: Self-pay

## 2017-05-30 DIAGNOSIS — O24415 Gestational diabetes mellitus in pregnancy, controlled by oral hypoglycemic drugs: Secondary | ICD-10-CM

## 2017-05-31 LAB — GLUCOSE TOLERANCE, 2 HOURS W/ 1HR
Glucose, 1 hour: 69 mg/dL (ref 65–179)
Glucose, 2 hour: 80 mg/dL (ref 65–152)
Glucose, Fasting: 92 mg/dL — ABNORMAL HIGH (ref 65–91)

## 2017-09-21 ENCOUNTER — Encounter (HOSPITAL_COMMUNITY): Payer: Self-pay | Admitting: Internal Medicine

## 2018-07-24 ENCOUNTER — Encounter: Payer: Self-pay | Admitting: Family Medicine

## 2018-07-24 ENCOUNTER — Ambulatory Visit: Payer: Self-pay | Attending: Family Medicine | Admitting: Family Medicine

## 2018-07-24 VITALS — BP 125/77 | HR 75 | Temp 98.2°F | Resp 18 | Ht 64.96 in | Wt 159.0 lb

## 2018-07-24 DIAGNOSIS — Z9049 Acquired absence of other specified parts of digestive tract: Secondary | ICD-10-CM | POA: Insufficient documentation

## 2018-07-24 DIAGNOSIS — Z9889 Other specified postprocedural states: Secondary | ICD-10-CM | POA: Insufficient documentation

## 2018-07-24 DIAGNOSIS — Z23 Encounter for immunization: Secondary | ICD-10-CM

## 2018-07-24 DIAGNOSIS — R51 Headache: Secondary | ICD-10-CM

## 2018-07-24 DIAGNOSIS — R42 Dizziness and giddiness: Secondary | ICD-10-CM

## 2018-07-24 DIAGNOSIS — Z131 Encounter for screening for diabetes mellitus: Secondary | ICD-10-CM

## 2018-07-24 DIAGNOSIS — Z8632 Personal history of gestational diabetes: Secondary | ICD-10-CM

## 2018-07-24 DIAGNOSIS — Z833 Family history of diabetes mellitus: Secondary | ICD-10-CM | POA: Insufficient documentation

## 2018-07-24 LAB — POCT GLYCOSYLATED HEMOGLOBIN (HGB A1C): Hemoglobin A1C: 5.2 % (ref 4.0–5.6)

## 2018-07-24 NOTE — Patient Instructions (Signed)
Prevencin de la diabetes mellitus tipo 2 Preventing Type 2 Diabetes Mellitus La diabetes tipo 2 (diabetes mellitus tipo 2) es una enfermedad a largo plazo (crnica) que afecta los niveles de azcar en la sangre (glucosa). Normalmente, una hormona denominada insulina permite el ingreso de la glucosa en las clulas del cuerpo. Las clulas usan la glucosa para Dealer. En la diabetes tipo 2, puede presentarse uno de los siguientes problemas, o ambos:  El cuerpo no produce la cantidad suficiente de insulina.  El cuerpo no responde de [place] a la insulina que produce (resistencia a la insulina).  La resistencia a la insulina o la falta de insulina hacen que el exceso de glucosa se acumule en la sangre, en lugar de ir a las clulas. Como resultado, se produce un nivel alto de glucemia (hiperglucemia), lo cual puede causar muchas complicaciones. Al tener sobreseo o ser obeso y tener un estilo de vida inactivo (sedentario) puede aumentar su riesgo de padecer diabetes. La diabetes tipo 2 se puede retrasar o prevenir al realizar determinados cambios en la alimentacin y el estilo de vida. Qu cambios se pueden Engineer, structural?  Consuma comidas y refrigerios saludables con regularidad. Tenga a mano un refrigerio saludable cuando sienta [employer], tal como una fruta o un puado de nueces.  Como carnes [place] y protenas con bajo contenido de grasas saturadas como pollo, pescado, claras de huevo y frijoles. Evite las carnes procesadas.  Coma muchas frutas y verduras, y muchos granos no procesados (granos enteros). Se recomienda que coma: ? 1 a 2 tazas de [employer]. ? 2 a 3 tazas de [employer]. ? 6 a 8 onzas de granos enteros todos los das, como avena, [place], trigo [place], arroz integral, quinoa y mijo.  Coma productos lcteos de bajo contenido graso, como [place], yogur y [place].  Coma alimentos que contengan grasas saludables, como  frutos secos, aguacate, aceite de Cable y aceite de canola.  Beba agua [place]. Evite los lquidos que contengan azcar agregada como gaseosas o t Paint.  Siga las indicaciones de su mdico respecto de las restricciones especficas para las comidas o las bebidas.  Controle la cantidad de alimentos que come por vez (tamao de la porcin). ? Verifique las etiquetas de los alimentos para verificar los tamaos de las porciones de los alimentos. ? Use una balanza de cocina para pesar las porciones de alimentos.  [place] o hierva al vapor los Building surveyor de frerlos. Cocine con agua o caldo en lugar de aceites o manteca.  Limite su consumo de: ? Sal (sodio). No consuma ms de 1 cucharadita (2,400mg ) de sodio por da. Si tiene enfermedad cardaca o presin arterial alta, debe consumir menos de  a  cucharadita (1,500mg ) de sodio por da. ? Grasas saturadas. Se trata de grasa slida a temperatura [employer] o la grasa de la carne. Qu cambios en el estilo de vida se pueden realizar?  Actividad  Haga actividad fsica de intensidad moderada durante al menos 33minutos como mnimo 5das por semana o con la frecuencia que le indique su mdico.  Pregntele al mdico qu actividades son seguras para usted. Una combinacin de actividades puede ser la mejor opcin, por ejemplo, caminar, practicar natacin, andar en bicicleta y hacer entrenamiento de fuerza.  Trate de agregar actividad fsica a su jornada. Por ejemplo: ? Estacione en lugares ms alejados de lo habitual para tener que caminar ms. Por ejemplo, estacione  en una equina alejada del estacionamiento cuando vaya a la oficina o a la tienda de comestibles. ? Vaya a caminar durante su hora de almuerzo. ? Utilice las Clinical cytogeneticist de ascensores o escaleras mecnicas. Bajar de peso  [place] de peso segn se le indique. El mdico puede determinar cuntos kilos tiene que bajar y [place] a que adelgace de Geographical information systems officer  segura.  Si tiene sobrepeso o es obeso, es posible que se le indique que pierda al menos entre el 5 y el 7% de su Engineer, site. Alcohol y tabaco   Limite el consumo de alcohol a no ms de 1 medida por da si es mujer y no est Music therapist y a 2 medidas por da si es hombre. Una medida equivale a 12onzas de cerveza, 5onzas de vino o 1onzas de bebidas alcohlicas de alta graduacin.  No consuma ningn producto que contenga tabaco, lo que incluye cigarrillos, tabaco de Higher education careers adviser y Psychologist, sport and exercise. Si necesita ayuda para dejar de fumar, consulte al mdico. Trabaje con su mdico  Contrlese la glucemia de [place] regular, segn lo indicado por su mdico.  Analice sus factores de riesgo y cmo puede reducir su riesgo de padecer diabetes.  Somtase a pruebas de Programme researcher, broadcasting/film/video segn lo indicado por su mdico. Puede realizarse pruebas de deteccin con regularidad, especialmente si tiene ciertos factores de riesgo de padecer diabetes tipo 2.  Haga una cita con un especialista en dietas y nutricin (nutricionista matriculado). Un nutricionista matriculado puede ayudarlo a crear un plan de alimentacin saludable y a comprender los tamaos de las porciones y las etiquetas de los alimentos. Por qu son importantes estos cambios?  Es posible prevenir o Nurse, children's diabetes tipo 2 y los problemas de salud relacionados al realizar cambios en el estilo de vida y Youth worker.  Puede ser difcil reconocer los signos de la diabetes tipo 2. La mejor manera de evitar posibles daos en el cuerpo es tomar medidas para prevenir la enfermedad antes que usted desarrolle los sntomas. Qu puede suceder si no se realizan cambios?  Sus niveles de glucemia pueden continuar aumentando. Tener la glucemia alta durante mucho tiempo es peligroso. Demasiada glucosa en la sangre puede daar los vasos sanguneos, el corazn, los riones, los nervios y los ojos.  Puede desarrollar prediabetes o diabetes tipo 2. La  diabetes tipo 2 puede ocasionar muchos problemas de salud crnicos y complicaciones, tales como: ? Enfermedad cardaca. ? Accidente cerebrovascular. ? Ceguera. ? Enfermedad renal. ? Depresin. ? Circulacin deficiente en los pies y las piernas, lo cual podra ocasionar una extirpacin quirrgica (amputacin) en casos graves. Dnde encontrar 84:  Pida a su mdico que le recomiende un nutricionista matriculado, Radio broadcast assistant en diabetes o un programa para Sports coach de [place].  Busque grupos de apoyo para bajar de peso a nivel local o en lnea.  Asista a un gimnasio, club de acondicionamiento fsico o nase a un grupo que realice actividad fsica al aire libre como un club de caminata. Dnde encontrar ms informacin: Para obtener ms informacin sobre la diabetes y la prevencin de la diabetes, visite:  Asociacin Americana de la Diabetes (American Diabetes Association, ADA): www.diabetes.[place] Diabetes y [place] y [facility] of Diabetes and Digestive and Kidney Diseases): FindSpin.nl  Para obtener ms informacin sobre [employer], visite:  Departamento de Agricultura de los EE.UU., Mi plato (U.S. Department of Agriculture, Engineer, structural, Choose My Plate): http://wiley-williams.com/  Oficina para la Prevencin de Albers y Promocin de [place], Games developer  de alimentacin Barista of Disease Prevention and Health Promotion, ODPHP, Dietary Guidelines): SurferLive.at  Resumen  Puede reducir su riesgo de padecer diabetes tipo 2 al aumentar su actividad fsica, comer alimentos saludables y bajar de peso segn se lo indiquen.  Hable con su mdico sobre su riesgo de padecer diabetes tipo 2. Pregunte [employer]  de sangre o pruebas de deteccin que debe Dispensing optician. Esta informacin no tiene Marine scientist el consejo del mdico. Asegrese de hacerle al mdico  cualquier pregunta que tenga. Document Released: 10/14/2015 Document Revised: 12/13/2016 Document Reviewed: 10/14/2015 Elsevier Interactive Patient Education  2018 [employer].

## 2018-07-24 NOTE — Progress Notes (Signed)
Subjective:    Patient ID: [name] Wu, female    DOB: 05/14/1981, 37 y.o.   MRN: [id number]  HPI       37 yo female new to the practice.  Patient reports that she had gestational diabetes in 2018 when she was pregnant.  Patient denies any current issues with increased thirst or urinary frequency.  Patient states that these have happened off and on over the past year but not currently.  Patient denies any significant medical history other than her gestational diabetes.  Patient has had prior removal of her gallbladder and tubal ligation.  Patient reports family history significant for mother with diabetes and hypertension.  Patient is married, has 2 daughters and 1 son.  Patient does not smoke or drink.  Patient reports no known drug allergies.  Review of systems, patient denies any cough or shortness of breath, no chest pain or palpitations.  Patient states that last week she had 1 day of lower abdominal pain that but this is resolved.  Patient denies fatigue.  Patient has occasional headaches and dizziness but not recently. Past Medical History:  Diagnosis Date  . Gallbladder disease   . Gestational diabetes    glyburide  . Symptomatic cholelithiasis 01/30/2013   Past Surgical History:  Procedure Laterality Date  . CHOLECYSTECTOMY N/A 04/04/2013   Procedure: LAPAROSCOPIC CHOLECYSTECTOMY POSSIBLE IOC;  Surgeon: Harl Bowie, MD;  Location: [facility];  Service: General;  Laterality: N/A;  . TUBAL LIGATION Bilateral 04/05/2017   Procedure: POST PARTUM TUBAL LIGATION;  Surgeon: Truett Mainland, DO;  Location: [facility];  Service: Gynecology;  Laterality: Bilateral;   Family History  Problem Relation Age of Onset  . Hypertension Mother   . Diabetes Mother   . Cancer Father        pancreatic  . Hypertension Maternal Grandmother    Social History   Tobacco Use  . Smoking status: Never Smoker  . Smokeless tobacco: Never Used  Substance Use Topics  . Alcohol  use: No  . Drug use: No  No Known Allergies   Review of Systems  Constitutional: Negative for chills, fatigue and fever.  HENT: Negative for congestion and dental problem.   Eyes: Negative for photophobia and visual disturbance.  Respiratory: Negative for cough and shortness of breath.   Cardiovascular: Negative for chest pain, palpitations and leg swelling.  Gastrointestinal: Positive for abdominal pain (lower abdominal pain last week; resolved). Negative for constipation, diarrhea and nausea.  Endocrine: Negative for polydipsia, polyphagia and polyuria.  Genitourinary: Positive for frequency (occasional). Negative for dysuria.  Musculoskeletal: Negative for arthralgias and back pain.  Neurological: Positive for dizziness and headaches.       Occasional headaches and dizziness, intermittent       Objective:   Physical Exam BP 125/77 (BP Location: Right Arm, Patient Position: Sitting, Cuff Size: Normal)   Pulse 75   Temp 98.2 F (36.8 C) (Oral)   Resp 18   Ht 5' 4.96" (1.65 m)   Wt 159 lb (72.1 kg)   LMP 07/02/2018   SpO2 98%   BMI 26.49 kg/m Nurse's notes and vital signs reviewed General-well-nourished, well-developed female in no acute distress.  Patient has her son and husband with her at today's visit. ENT- right TM is light pink but with visible landmarks.  Left TM is slightly dull, nares with mild edema of the nasal turbinates, patient with mild posterior pharynx erythema (patient denies any nasal congestion or cold symptoms.) Neck-supple,  no lymphadenopathy, no thyromegaly Lungs-clear to auscultation bilaterally Cardiovascular-regular rate and rhythm Abdomen-soft, nontender Back-no CVA tenderness Extremities-no edema       Assessment & Plan:  1. History of gestational diabetes Patient with a history of gestational diabetes/abnormal glucose tolerance test.  Patient will have hemoglobin A1c.  Patient with normal hemoglobin A1c of 5.2.  Patient will be given handout  however on preventing type 2 diabetes as part of her AVS .- POCT A1C  2. Need for immunization against influenza Patient was offered and agreed to have influenza immunization at today's visit  Allergies as of 07/24/2018   No Known Allergies     Medication List        Accurate as of 07/24/18  9:16 AM. Always use your most recent med list.          ibuprofen 600 MG tablet Commonly known as:  ADVIL,MOTRIN Take 1 tablet (600 mg total) by mouth every 6 (six) hours as needed.   PRENATAL/FOLIC ACID Tabs Take 1 tablet by mouth at bedtime.      An After Visit Summary was printed and given to the patient.  Return for consider yearly well exam and as needed.

## 2018-10-24 ENCOUNTER — Other Ambulatory Visit: Payer: Medicaid Other | Admitting: Family Medicine

## 2018-10-26 ENCOUNTER — Other Ambulatory Visit: Payer: Medicaid Other | Admitting: Family Medicine

## 2018-11-24 ENCOUNTER — Ambulatory Visit: Payer: Self-pay | Attending: Family Medicine | Admitting: Family Medicine

## 2018-11-24 ENCOUNTER — Other Ambulatory Visit: Payer: Self-pay

## 2018-11-24 VITALS — BP 115/68 | HR 66 | Temp 98.6°F | Resp 18 | Ht 64.0 in | Wt 160.0 lb

## 2018-11-24 DIAGNOSIS — Z113 Encounter for screening for infections with a predominantly sexual mode of transmission: Secondary | ICD-10-CM

## 2018-11-24 DIAGNOSIS — Z9049 Acquired absence of other specified parts of digestive tract: Secondary | ICD-10-CM | POA: Insufficient documentation

## 2018-11-24 DIAGNOSIS — Z800 Family history of malignant neoplasm of digestive organs: Secondary | ICD-10-CM | POA: Insufficient documentation

## 2018-11-24 DIAGNOSIS — Z124 Encounter for screening for malignant neoplasm of cervix: Secondary | ICD-10-CM

## 2018-11-24 DIAGNOSIS — Z833 Family history of diabetes mellitus: Secondary | ICD-10-CM | POA: Insufficient documentation

## 2018-11-24 DIAGNOSIS — N898 Other specified noninflammatory disorders of vagina: Secondary | ICD-10-CM

## 2018-11-24 NOTE — Progress Notes (Signed)
Established Patient Office Visit  Subjective:  Patient ID: [name] Wu, female    DOB: May 15, 1981  Age: 38 y.o. MRN: [id number]  CC:  Chief Complaint  Patient presents with  . Gynecologic Exam    HPI [name] Wu V [name] Wu presents for screening for cervical cancer and she would also like to be tested for sexually transmitted diseases. Patient's last pap per chart was done 12/11/2013 and was normal with negative HPV.      Patient reports no current concerns. She has had no abdominal or pelvic pain and has not noticed any vaginal discharge. Patient has found no abnormalities on self breast exam. She feels that she is doing well at this time.   Past Medical History:  Diagnosis Date  . Gallbladder disease   . Gestational diabetes    glyburide  . Symptomatic cholelithiasis 01/30/2013    Past Surgical History:  Procedure Laterality Date  . CHOLECYSTECTOMY N/A 04/04/2013   Procedure: LAPAROSCOPIC CHOLECYSTECTOMY POSSIBLE IOC;  Surgeon: Harl Bowie, MD;  Location: [facility];  Service: General;  Laterality: N/A;  . TUBAL LIGATION Bilateral 04/05/2017   Procedure: POST PARTUM TUBAL LIGATION;  Surgeon: Truett Mainland, DO;  Location: [facility];  Service: Gynecology;  Laterality: Bilateral;    Family History  Problem Relation Age of Onset  . Hypertension Mother   . Diabetes Mother   . Cancer Father        pancreatic  . Hypertension Maternal Grandmother     Social History   Socioeconomic History  . Marital status: Married    Spouse name: Jose  . Number of children: 2  . Years of education: 6  . Highest education level: Not on file  Occupational History  . Occupation: homemaker  Social Needs  . Financial resource strain: Not on file  . Food insecurity:    Worry: Not on file    Inability: Not on file  . Transportation needs:    Medical: Not on file    Non-medical: Not on file  Tobacco Use  . Smoking status: Never Smoker  . Smokeless tobacco:  Never Used  Substance and Sexual Activity  . Alcohol use: No  . Drug use: No  . Sexual activity: Not Currently    Partners: Male    Birth control/protection: None  Lifestyle  . Physical activity:    Days per week: Not on file    Minutes per session: Not on file  . Stress: Not on file  Relationships  . Social connections:    Talks on phone: Not on file    Gets together: Not on file    Attends religious service: Not on file    Active member of club or organization: Not on file    Attends meetings of clubs or organizations: Not on file    Relationship status: Not on file  . Intimate partner violence:    Fear of current or ex partner: Not on file    Emotionally abused: Not on file    Physically abused: Not on file    Forced sexual activity: Not on file  Other Topics Concern  . Not on file  Social History Narrative   Lives with her husband and their 2 daughters and one son. From [place]. Came to the [place] in 2010.        Outpatient Medications Prior to Visit  Medication Sig Dispense Refill  . ibuprofen (ADVIL,MOTRIN) 600 MG tablet Take 1 tablet (600 mg total) by mouth every  6 (six) hours as needed. 30 tablet 0  . Prenatal Vit-Fe Fumarate-FA (PRENATAL/FOLIC ACID) TABS Take 1 tablet by mouth at bedtime.     No facility-administered medications prior to visit.     No Known Allergies  ROS Review of Systems  Constitutional: Negative for chills and fatigue.  HENT: Negative for congestion, hearing loss and trouble swallowing.   Eyes: Negative for photophobia and visual disturbance.  Respiratory: Negative for cough and shortness of breath.   Cardiovascular: Negative for chest pain, palpitations and leg swelling.  Gastrointestinal: Negative for abdominal pain, blood in stool, constipation, diarrhea and nausea.  Endocrine: Negative for polydipsia, polyphagia and polyuria.  Genitourinary: Negative for dysuria, frequency, menstrual problem and vaginal discharge.  Musculoskeletal:  Negative for arthralgias and back pain.  Neurological: Negative for dizziness and headaches.  Hematological: Negative for adenopathy. Does not bruise/bleed easily.      Objective:    Physical Exam  Constitutional: She is oriented to person, place, and time. She appears well-developed and well-nourished.  Neck: Normal range of motion. Neck supple. No thyromegaly present.  Cardiovascular: Normal rate and regular rhythm.  Pulmonary/Chest: Effort normal and breath sounds normal. No breast swelling, tenderness, discharge or bleeding.  Abdominal: Soft. There is no abdominal tenderness. There is no rebound and no guarding.  Genitourinary:    Uterus normal.     Vaginal discharge present.     No vaginal erythema or tenderness.  No erythema or tenderness in the vagina.  Musculoskeletal: Normal range of motion.        General: No tenderness or edema.  Lymphadenopathy:    She has no cervical adenopathy.  Neurological: She is alert and oriented to person, place, and time.  Skin: Skin is warm and dry.  Psychiatric: She has a normal mood and affect. Her behavior is normal. Judgment and thought content normal.  Nursing note and vitals reviewed.   BP 115/68 (BP Location: Left Arm, Patient Position: Sitting, Cuff Size: Normal)   Pulse 66   Temp 98.6 F (37 C) (Oral)   Resp 18   Ht 5\' 4"  (1.626 m)   Wt 160 lb (72.6 kg)   LMP 10/30/2018   SpO2 97%   BMI 27.46 kg/m  Wt Readings from Last 3 Encounters:  11/24/18 160 lb (72.6 kg)  07/24/18 159 lb (72.1 kg)  05/04/17 151 lb (68.5 kg)     Health Maintenance Due  Topic Date Due  . PAP SMEAR-Modifier  12/11/2016    There are no preventive care reminders to display for this patient.  Lab Results  Component Value Date   TSH 2.021 12/11/2013   Lab Results  Component Value Date   WBC 6.8 04/04/2017   HGB 12.2 04/04/2017   HCT 37.1 04/04/2017   MCV 81.7 04/04/2017   PLT 243 04/04/2017   Lab Results  Component Value Date   NA 138  12/11/2013   K 4.2 12/11/2013   CO2 26 12/11/2013   GLUCOSE 104 (H) 12/11/2013   BUN 10 12/11/2013   CREATININE 0.72 12/11/2013   BILITOT 0.8 12/11/2013   ALKPHOS 41 12/11/2013   AST 10 12/11/2013   ALT 8 12/11/2013   PROT 6.8 12/11/2013   ALBUMIN 4.2 12/11/2013   CALCIUM 8.9 12/11/2013   Lab Results  Component Value Date   CHOL 142 12/11/2013   Lab Results  Component Value Date   HDL 40 12/11/2013   Lab Results  Component Value Date   LDLCALC 86 12/11/2013   Lab Results  Component Value Date   TRIG 82 12/11/2013   Lab Results  Component Value Date   CHOLHDL 3.6 12/11/2013   Lab Results  Component Value Date   HGBA1C 5.2 07/24/2018      Assessment & Plan:  1. Screening for cervical cancer Pap smear done at today's visit as a screening test for cervical cancer. She will be notified of the results and if any further follow-up is needed based on her results. Handout given on well female/preventative care for patient's age group.  - Cytology - PAP  2. Vaginal discharge Patient with presence of vaginal discharge on exam and testing will be done for STI's as well as bacterial vaginitis or candidal infection - Cervicovaginal ancillary only  An After Visit Summary was printed and given to the patient.  Return in about 6 months (around 05/27/2019) for history of gestational diabetes.    Follow-up: Return in about 6 months (around 05/27/2019) for history of gestational diabetes.   Antony Blackbird, MD

## 2018-11-24 NOTE — Patient Instructions (Signed)
Cuidados preventivos en las mujeres de 18 a 39 aos de edad  Preventive Care 18-39 Years, Female  Introduccin  Los cuidados preventivos hacen referencia a las opciones en cuanto al estilo de vida y a las visitas al mdico, las cuales pueden promover la salud y el bienestar.  Qu incluyen los cuidados preventivos?     Un examen fsico anual. Esto tambin se conoce como control de bienestar anual.   Exmenes dentales una o dos veces al ao.   Exmenes de la vista de rutina. Pregntale al mdico con qu frecuencia debes realizarte un control de la vista.   Opciones personales de estilo de vida, que incluyen lo siguiente:  ? Cuidarse los dientes y las encas a diario.  ? Realizar actividad fsica con regularidad.  ? Consumir una dieta saludable.  ? Evitar el consumo de tabaco y drogas.  ? Limitar el consumo de bebidas alcohlicas.  ? Practicar el sexo seguro.  ? Tomar los suplementos de vitaminas o minerales como se lo haya indicado el mdico.  Qu sucede durante un control de bienestar anual?  Los servicios y exmenes de deteccin realizados por su mdico durante el control de bienestar anual dependern de su salud general, factores de riesgo de estilo de vida y los antecedentes familiares de enfermedades.  Psicoterapia  Su mdico puede preguntarle acerca de:   Consumo de alcohol.   Consumo de tabaco.   Consumo de drogas.   Bienestar emocional.   Bienestar en el hogar y las relaciones personales.   Tener relaciones sexuales.   Hbitos de alimentacin.   Trabajo y ambiente laboral.   Mtodos anticonceptivos.   El ciclo menstrual.   Antecedentes de embarazo.  Pruebas de deteccin  Pueden hacerte las siguientes pruebas o mediciones:   Estatura, peso e ndice de masa muscular (IMC).   Pruebas de deteccin de la diabetes. Esto se realiza mediante un control del azcar en la sangre (glucosa) despus de no haber comido durante un periodo de tiempo (ayuno).   Presin arterial.   Niveles de lpidos y  colesterol. Estos se pueden verificar cada 5 aos, a partir de los 20 aos de edad.   Control de la piel.   Anlisis de sangre para la deteccin de la hepatitis C.   Anlisis de sangre para la deteccin de la hepatitis B.   Anlisis de enfermedades de transmisin sexual (ETS).   Pruebas de deteccin de cncer relacionado con las mutaciones del BRCA. Es posible que se los deba realizar si tiene antecedentes de cncer de mama, de ovario, de trompas o peritoneal.   Examen plvico y prueba de Papanicolaou. Esto se puede realizar cada 3aos a partir de los 21aos de edad. A partir de los 30 aos, esto se puede realizar cada 5 aos si usted se realiza una prueba de Papanicolaou en combinacin con una prueba de deteccin del virus del papiloma humano (VPH).  Hable con su mdico para analizar los resultados, las opciones de tratamiento y, si corresponde, la necesidad de realizar ms pruebas.  Vacunas  El mdico puede recomendarte que te apliques algunas vacunas, por ejemplo:   Vacuna contra la gripe. Se recomienda aplicarse esta vacuna todos los aos.   Vacuna contra la difteria, ttanos y tos ferina acelular (DTPa, DT). Es posible que tengas que aplicarte un refuerzo contra el ttanos y la difteria (DT) cada 10aos.   Vacuna contra la varicela. Es posible que tengas que aplicrtela si no recibiste esta vacuna.   Vacuna contra el VPH.   Si eres menor de 26aos de edad, puede que necesites aplicarte tres dosis a lo largo de 6meses.   Vacuna contra el sarampin, rubola y paperas (SRP). Tendrs que aplicarte por lo menos una dosis de la SRP. Podras tambin necesitar una segunda dosis.   Vacuna antineumoccica conjugada 13 valente (PCV13). Puede necesitar esta vacuna si tiene determinadas enfermedades y no se vacun anteriormente.   Vacuna antineumoccica de polisacridos (PPSV23). Quizs tengas que aplicarte una o dos dosis si fumas o si sufres determinadas enfermedades.   Vacuna antimeningoccica. Se  recomienda la aplicacin de una dosis si tienes entre 19 y 21aos de edad, y eres un estudiante universitario de primer ao que vive en una residencia estudiantil, o si tienes una de varias enfermedades. Podras tambin necesitar dosis de refuerzo.   Vacuna contra la hepatitis A. Es posible que necesites esta vacuna si tienes ciertas afecciones o si viajas o trabajas en lugares en los que podras estar expuesto a la hepatitis A.   Vacuna contra la hepatitis B. Es posible que tengas que aplicarte esta vacuna si tienes determinadas enfermedades, o si trabajas en lugares donde puedes estar expuesto a la hepatitisB o viajas a estas zonas.   Vacuna contra Haemophilus influenzae tipoB (Hib). Es posible que necesites esta vacuna si tienes algunos factores de riesgo.  Habla con el mdico sobre qu pruebas de deteccin y qu vacunas necesitas, y con qu frecuencia las necesitas.  Esta informacin no tiene como fin reemplazar el consejo del mdico. Asegrese de hacerle al mdico cualquier pregunta que tenga.  Document Released: 06/02/2005 Document Revised: 05/04/2017 Document Reviewed: 06/24/2015  Elsevier Interactive Patient Education  2019 [employer] Inc.

## 2018-11-27 LAB — CERVICOVAGINAL ANCILLARY ONLY
Bacterial vaginitis: NEGATIVE
Candida vaginitis: NEGATIVE
Chlamydia: NEGATIVE
Neisseria Gonorrhea: NEGATIVE
Trichomonas: NEGATIVE

## 2018-11-28 LAB — CYTOLOGY - PAP
Diagnosis: NEGATIVE
HPV: NOT DETECTED

## 2018-11-30 ENCOUNTER — Encounter: Payer: Self-pay | Admitting: Family Medicine

## 2022-05-13 ENCOUNTER — Encounter: Payer: Self-pay | Admitting: Obstetrics & Gynecology

## 2022-05-13 ENCOUNTER — Other Ambulatory Visit (HOSPITAL_COMMUNITY)
Admission: RE | Admit: 2022-05-13 | Discharge: 2022-05-13 | Disposition: A | Discharge status: Home / Self Care | Payer: Medicaid Other | Source: Ambulatory Visit | Attending: Obstetrics & Gynecology | Admitting: Obstetrics & Gynecology

## 2022-05-13 ENCOUNTER — Ambulatory Visit (INDEPENDENT_AMBULATORY_CARE_PROVIDER_SITE_OTHER): Payer: Medicaid Other | Admitting: Obstetrics & Gynecology

## 2022-05-13 VITALS — BP 117/66 | HR 69 | Ht 64.0 in | Wt 174.2 lb

## 2022-05-13 DIAGNOSIS — Z01419 Encounter for gynecological examination (general) (routine) without abnormal findings: Secondary | ICD-10-CM | POA: Diagnosis present

## 2022-05-13 DIAGNOSIS — R102 Pelvic and perineal pain: Secondary | ICD-10-CM

## 2022-05-13 DIAGNOSIS — Z0000 Encounter for general adult medical examination without abnormal findings: Secondary | ICD-10-CM | POA: Diagnosis not present

## 2022-05-13 DIAGNOSIS — Z1231 Encounter for screening mammogram for malignant neoplasm of breast: Secondary | ICD-10-CM

## 2022-05-13 DIAGNOSIS — Z01411 Encounter for gynecological examination (general) (routine) with abnormal findings: Secondary | ICD-10-CM | POA: Diagnosis not present

## 2022-05-13 NOTE — Patient Instructions (Signed)
Please schedule a mammogram at one of the following locations:  [place]: [phone]  [facility] [place]:[phone] [address]  

## 2022-05-13 NOTE — Progress Notes (Signed)
WELL-WOMAN EXAMINATION Patient name: [name] Wu MRN [id number]  Date of birth: 10/01/1980 Chief Complaint:   Gynecologic Exam  History of Present Illness:   [name] Wu is a 41 y.o. [phone] female being seen today for a routine well-woman exam and the following concerns:  Phone spanish interpreter used  Pelvic pain: Pain on her left side- about 2 mos. Intermittent pain, sometimes for just a day every couple of weeks.  Rates pain 6/10, no medication.  Describes pain as sharp.  No radiation of pain. Denies nausea/vomiting. Denies diarrhea/constipation or urinary concerns.  No other acute complaints.  Menses regular each month- denies dysmenorrhea.  Denies intermenstrual bleeding.   No LMP recorded.  The current method of family planning is tubal ligation.    Last pap 2020.  Last mammogram: never. Last colonoscopy: n/a     02/08/2017    5:14 PM 11/02/2016    1:48 PM 10/12/2016   11:06 AM  Depression screen PHQ 2/9  Decreased Interest 0 0 1  Down, Depressed, Hopeless 0 0 0  PHQ - 2 Score 0 0 1  Altered sleeping  0   Tired, decreased energy  0   Change in appetite  0   Feeling bad or failure about yourself   0   Trouble concentrating  0   Moving slowly or fidgety/restless  0   Suicidal thoughts  0   PHQ-9 Score  0       Review of Systems:   Pertinent items are noted in HPI Denies any headaches, blurred vision, fatigue, shortness of breath, chest pain, abdominal pain, bowel movements, urination, or intercourse unless otherwise stated above.  Pertinent History Reviewed:  Reviewed past medical,surgical, social and family history.  Reviewed problem list, medications and allergies. Physical Assessment:   Vitals:   05/13/22 1522  BP: 117/66  Pulse: 69  Weight: 174 lb 3.2 oz (79 kg)  Height: '5\' 4"'$  (1.626 m)  Body mass index is 29.9 kg/m.        Physical Examination:   General appearance - well appearing, and in no distress  Mental status - alert,  oriented to person, place, and time  Psych:  She has a normal mood and affect  Skin - warm and dry, normal color, no suspicious lesions noted  Chest - effort normal, all lung fields clear to auscultation bilaterally  Heart - normal rate and regular rhythm  Neck:  midline trachea, no thyromegaly or nodules  Breasts - breasts appear normal, no suspicious masses, no skin or nipple changes or  axillary nodes  Abdomen - soft, nontender, nondistended, no masses or organomegaly, mild tenderness with deep palpation- LLQ  Pelvic - VULVA: normal appearing vulva with no masses, tenderness or lesions  VAGINA: normal appearing vagina with normal color and discharge, no lesions  CERVIX: normal appearing cervix without discharge or lesions, no CMT  Thin prep pap is done with HR HPV cotesting  UTERUS: uterus is felt to be normal size, shape, consistency and nontender   ADNEXA: No adnexal masses or tenderness noted.  Extremities:  No swelling or varicosities noted  Chaperone: Celene Squibb     Assessment & Plan:  1) Well-Woman Exam -pap collected, reviewed screening guidelines -mammogram ordered  2) Pelvic pain -no abnormalities noted on exam -plan for pelvic US to r/o underlying etiology- further management pending results  Orders Placed This Encounter  Procedures   MM 3D SCREEN BREAST BILATERAL   US PELVIC COMPLETE WITH TRANSVAGINAL  Meds: No orders of the defined types were placed in this encounter.   Follow-up: Return in about 1 year (around 05/14/2023) for Annual. Pelvic US ordered   Janyth Pupa, DO Attending [place], [facility] for [facility], [place]

## 2022-05-16 ENCOUNTER — Ambulatory Visit (HOSPITAL_BASED_OUTPATIENT_CLINIC_OR_DEPARTMENT_OTHER)
Admission: RE | Admit: 2022-05-16 | Discharge: 2022-05-16 | Disposition: A | Discharge status: Home / Self Care | Payer: Self-pay | Source: Ambulatory Visit | Attending: Obstetrics & Gynecology | Admitting: Obstetrics & Gynecology

## 2022-05-16 DIAGNOSIS — R102 Pelvic and perineal pain: Secondary | ICD-10-CM

## 2022-05-16 DIAGNOSIS — Z1231 Encounter for screening mammogram for malignant neoplasm of breast: Secondary | ICD-10-CM | POA: Insufficient documentation

## 2022-05-19 LAB — CYTOLOGY - PAP
Chlamydia: NEGATIVE
Comment: NEGATIVE
Comment: NEGATIVE
Comment: NORMAL
Diagnosis: NEGATIVE
High risk HPV: NEGATIVE
Neisseria Gonorrhea: NEGATIVE

## 2024-03-22 ENCOUNTER — Ambulatory Visit (HOSPITAL_COMMUNITY)
Admission: EM | Admit: 2024-03-22 | Discharge: 2024-03-22 | Disposition: A | Discharge status: Home / Self Care | Attending: Family Medicine | Admitting: Family Medicine

## 2024-03-22 ENCOUNTER — Encounter (HOSPITAL_COMMUNITY): Payer: Self-pay

## 2024-03-22 DIAGNOSIS — Z3202 Encounter for pregnancy test, result negative: Secondary | ICD-10-CM | POA: Diagnosis not present

## 2024-03-22 DIAGNOSIS — N3000 Acute cystitis without hematuria: Secondary | ICD-10-CM | POA: Insufficient documentation

## 2024-03-22 LAB — POCT URINALYSIS DIP (MANUAL ENTRY)
Bilirubin, UA: NEGATIVE
Glucose, UA: 100 mg/dL — AB
Ketones, POC UA: NEGATIVE mg/dL
Nitrite, UA: POSITIVE — AB
Protein Ur, POC: >=300 mg/dL — AB
Spec Grav, UA: 1.025 (ref 1.010–1.025)
Urobilinogen, UA: 1 U/dL
pH, UA: 6.5 (ref 5.0–8.0)

## 2024-03-22 LAB — POCT URINE PREGNANCY: Preg Test, Ur: NEGATIVE

## 2024-03-22 MED ORDER — NITROFURANTOIN MONOHYD MACRO 100 MG PO CAPS
100.0000 mg | ORAL_CAPSULE | Freq: Two times a day (BID) | ORAL | 0 refills | Status: AC
Start: 2024-03-22 — End: ?

## 2024-03-22 NOTE — ED Triage Notes (Signed)
 Patient c/o dysuria and urinary frequency x 2-3 days.  Patient has been taking Azo.

## 2024-03-22 NOTE — ED Provider Notes (Signed)
 [facility]    CSN: [id number] Arrival date & time: 03/22/24  1052      History   Chief Complaint Chief Complaint  Patient presents with   Dysuria   Urinary Frequency    HPI [name] Wu is a 43 y.o. female.   Patient presents to clinic for concern of dysuria, urgency and frequency for the past 3 days.  Noticed her symptoms got worse last night and she took an Azo.  Has had bilateral flank pain.  Without fever.  Denies abdominal pain or pelvic pain.  Denies hematuria.  Some nausea, no vomiting.  The history is provided by the patient and medical records.  Dysuria Urinary Frequency    Past Medical History:  Diagnosis Date   Gallbladder disease    Gestational diabetes    glyburide    Symptomatic cholelithiasis 01/30/2013    Patient Active Problem List   Diagnosis Date Noted   Normal labor 04/04/2017   Oral hypoglycemic controlled White classification A2 gestational diabetes mellitus (GDM) 02/28/2017   Bronchitis, acute 02/28/2017   Supervision of normal pregnancy 11/02/2016    Past Surgical History:  Procedure Laterality Date   CHOLECYSTECTOMY N/A 04/04/2013   Procedure: LAPAROSCOPIC CHOLECYSTECTOMY POSSIBLE IOC;  Surgeon: Vicenta DELENA Poli, MD;  Location: [facility];  Service: General;  Laterality: N/A;   TUBAL LIGATION Bilateral 04/05/2017   Procedure: POST PARTUM TUBAL LIGATION;  Surgeon: Barbra Lang PARAS, DO;  Location: [facility];  Service: Gynecology;  Laterality: Bilateral;    OB History     Gravida  3   Para  3   Term  3   Preterm      AB      Living  3      SAB      IAB      Ectopic      Multiple  0   Live Births  3            Home Medications    Prior to Admission medications   Medication Sig Start Date End Date Taking? Authorizing Provider  nitrofurantoin , macrocrystal-monohydrate, (MACROBID ) 100 MG capsule Take 1 capsule (100 mg total) by mouth 2 (two) times daily. 03/22/24  Yes Dreama,  Waneta Fitting  N, FNP    Family History Family History  Problem Relation Age of Onset   Hypertension Mother    Diabetes Mother    Cancer Father        pancreatic   Hypertension Maternal Grandmother     Social History Social History   Tobacco Use   Smoking status: Never   Smokeless tobacco: Never  Vaping Use   Vaping status: Never Used  Substance Use Topics   Alcohol use: No   Drug use: No     Allergies   Patient has no known allergies.   Review of Systems Review of Systems  Per HPI  Physical Exam Triage Vital Signs ED Triage Vitals [03/22/24 1107]  Encounter Vitals Group     BP 136/65     Girls Systolic BP Percentile      Girls Diastolic BP Percentile      Boys Systolic BP Percentile      Boys Diastolic BP Percentile      Pulse Rate 67     Resp 16     Temp 98.9 F (37.2 C)     Temp Source Oral     SpO2 98 %     Weight      Height  Head Circumference      Peak Flow      Pain Score 10     Pain Loc      Pain Education      Exclude from Growth Chart    No data found.  Updated Vital Signs BP 136/65 (BP Location: Right Arm)   Pulse 67   Temp 98.9 F (37.2 C) (Oral)   Resp 16   LMP 03/17/2024   SpO2 98%   Visual Acuity Right Eye Distance:   Left Eye Distance:   Bilateral Distance:    Right Eye Near:   Left Eye Near:    Bilateral Near:     Physical Exam Vitals and nursing note reviewed.  Constitutional:      Appearance: Normal appearance.  HENT:     Head: Normocephalic and atraumatic.     Right Ear: External ear normal.     Left Ear: External ear normal.     Nose: Nose normal.     Mouth/Throat:     Mouth: Mucous membranes are moist.  Eyes:     Conjunctiva/sclera: Conjunctivae normal.  Cardiovascular:     Rate and Rhythm: Normal rate.  Pulmonary:     Effort: Pulmonary effort is normal. No respiratory distress.  Abdominal:     Tenderness: There is no right CVA tenderness or left CVA tenderness.  Neurological:     General: No focal  deficit present.     Mental Status: She is alert and oriented to person, place, and time.  Psychiatric:        Mood and Affect: Mood normal.        Behavior: Behavior normal.      UC Treatments / Results  Labs (all labs ordered are listed, but only abnormal results are displayed) Labs Reviewed  POCT URINALYSIS DIP (MANUAL ENTRY) - Abnormal; Notable for the following components:      Result Value   Color, UA straw (*)    Clarity, UA cloudy (*)    Glucose, UA =100 (*)    Blood, UA large (*)    Protein Ur, POC >=300 (*)    Nitrite, UA Positive (*)    Leukocytes, UA Small (1+) (*)    All other components within normal limits  URINE CULTURE  POCT URINE PREGNANCY    EKG   Radiology No results found.  Procedures Procedures (including critical care time)  Medications Ordered in UC Medications - No data to display  Initial Impression / Assessment and Plan / UC Course  I have reviewed the triage vital signs and the nursing notes.  Pertinent labs & imaging results that were available during my care of the patient were reviewed by me and considered in my medical decision making (see chart for details).  Vitals and triage reviewed, patient is hemodynamically stable.  Urinary symptoms, UA shows large red blood cells, protein, nitrates and leukocytes.  Does have bilateral flank pain, negative for CVA tenderness, afebrile without tachycardia, low concern for Pyelonephritis at this time.  Urine pregnancy is negative.  Urine culture sent and staff will contact if treatment modification is needed.  Will treat with Macrobid .  Symptomatic management discussed.  Plan of care, follow-up care return precautions given, no questions at this time.    Final Clinical Impressions(s) / UC Diagnoses   Final diagnoses:  Acute cystitis without hematuria     Discharge Instructions      Su orina muestra una infeccin del tracto urinario. Tome Macrobid  [employer] al da  durante 5 das. Beba al  menos 1 litro de agua al da para [employer] riones. Tome Azo segn sea necesario para Acupuncturist ardor y la disuria. El personal se pondr en contacto con usted si necesita modificar sus antibiticos. Regrese a la clnica si no observa [employer] o si observa algn cambio.  Your urine shows that you have a urinary tract infection, take the Macrobid  twice daily for 5 days. Drink at least 64 ounces of water daily to flush kidneys, you take Azo as needed to help with the burning and dysuria.  Staff will contact you if you need to modify your antibiotics.  Return to clinic if no improvement over the next few days or any changes.    ED Prescriptions     Medication Sig Dispense Auth. Provider   nitrofurantoin , macrocrystal-monohydrate, (MACROBID ) 100 MG capsule Take 1 capsule (100 mg total) by mouth 2 (two) times daily. 10 capsule Dreama, Normand Damron  N, FNP      PDMP not reviewed this encounter.   Dreama Darol SAILOR, FNP 03/22/24 1233

## 2024-03-22 NOTE — Discharge Instructions (Addendum)
 Su orina muestra una infeccin del tracto urinario. Tome Macrobid  dos veces al da durante 5 [place]. Beba al menos 1 litro de agua al da para [employer] riones. Tome Azo segn sea necesario para Acupuncturist ardor y la disuria. El personal se pondr en contacto con usted si necesita modificar sus antibiticos. Regrese a la clnica si no observa [employer] o si observa algn cambio.  Your urine shows that you have a urinary tract infection, take the Macrobid  twice daily for 5 days. Drink at least 64 ounces of water daily to flush kidneys, you take Azo as needed to help with the burning and dysuria.  Staff will contact you if you need to modify your antibiotics.  Return to clinic if no improvement over the next few days or any changes.

## 2024-03-25 ENCOUNTER — Ambulatory Visit (HOSPITAL_COMMUNITY): Payer: Self-pay | Admitting: Emergency Medicine

## 2024-03-25 LAB — URINE CULTURE: Culture: >=100000 — AB

## 2024-04-06 DIAGNOSIS — R300 Dysuria: Secondary | ICD-10-CM | POA: Diagnosis not present

## 2024-04-06 DIAGNOSIS — Z114 Encounter for screening for human immunodeficiency virus [HIV]: Secondary | ICD-10-CM | POA: Diagnosis not present

## 2024-04-06 DIAGNOSIS — Z113 Encounter for screening for infections with a predominantly sexual mode of transmission: Secondary | ICD-10-CM | POA: Diagnosis not present

## 2024-04-10 DIAGNOSIS — Z130 Encounter for screening for diseases of the blood and blood-forming organs and certain disorders involving the immune mechanism: Secondary | ICD-10-CM | POA: Diagnosis not present

## 2024-04-10 DIAGNOSIS — M545 Low back pain, unspecified: Secondary | ICD-10-CM | POA: Diagnosis not present

## 2024-04-10 DIAGNOSIS — Z131 Encounter for screening for diabetes mellitus: Secondary | ICD-10-CM | POA: Diagnosis not present

## 2024-04-10 DIAGNOSIS — R300 Dysuria: Secondary | ICD-10-CM | POA: Diagnosis not present

## 2024-04-10 DIAGNOSIS — Z113 Encounter for screening for infections with a predominantly sexual mode of transmission: Secondary | ICD-10-CM | POA: Diagnosis not present

## 2024-04-10 DIAGNOSIS — Z1329 Encounter for screening for other suspected endocrine disorder: Secondary | ICD-10-CM | POA: Diagnosis not present

## 2024-04-10 DIAGNOSIS — Z0001 Encounter for general adult medical examination with abnormal findings: Secondary | ICD-10-CM | POA: Diagnosis not present

## 2024-04-10 DIAGNOSIS — Z9851 Tubal ligation status: Secondary | ICD-10-CM | POA: Diagnosis not present

## 2024-05-17 DIAGNOSIS — R300 Dysuria: Secondary | ICD-10-CM | POA: Diagnosis not present

## 2024-05-17 DIAGNOSIS — M545 Low back pain, unspecified: Secondary | ICD-10-CM | POA: Diagnosis not present

## 2024-05-17 DIAGNOSIS — Z712 Person consulting for explanation of examination or test findings: Secondary | ICD-10-CM | POA: Diagnosis not present

## 2024-05-17 DIAGNOSIS — E119 Type 2 diabetes mellitus without complications: Secondary | ICD-10-CM | POA: Diagnosis not present

## 2024-05-17 DIAGNOSIS — Z7189 Other specified counseling: Secondary | ICD-10-CM | POA: Diagnosis not present

## 2024-05-17 DIAGNOSIS — Z6830 Body mass index (BMI) 30.0-30.9, adult: Secondary | ICD-10-CM | POA: Diagnosis not present

## 2024-05-17 DIAGNOSIS — E669 Obesity, unspecified: Secondary | ICD-10-CM | POA: Diagnosis not present

## 2024-05-17 DIAGNOSIS — Z7182 Exercise counseling: Secondary | ICD-10-CM | POA: Diagnosis not present

## 2024-05-17 DIAGNOSIS — Z713 Dietary counseling and surveillance: Secondary | ICD-10-CM | POA: Diagnosis not present
